# Patient Record
Sex: Female | Born: 1985 | Race: White | Marital: Married | Smoking: Former smoker
Health system: Northeastern US, Community
[De-identification: ages and names within clinical notes are randomized; demographics above are authoritative.]

## PROBLEM LIST (undated history)

## (undated) DIAGNOSIS — E079 Disorder of thyroid, unspecified: Secondary | ICD-10-CM

## (undated) DIAGNOSIS — F909 Attention-deficit hyperactivity disorder, unspecified type: Secondary | ICD-10-CM

## (undated) HISTORY — DX: Attention-deficit hyperactivity disorder, unspecified type: F90.9

## (undated) HISTORY — DX: Disorder of thyroid, unspecified: E07.9

---

## 2011-01-22 ENCOUNTER — Emergency Department (HOSPITAL_BASED_OUTPATIENT_CLINIC_OR_DEPARTMENT_OTHER)
Admission: EM | Admit: 2011-01-22 | Discharge: 2011-01-23 | Disposition: A | Discharge status: Home / Self Care | Payer: Managed Care, Other (non HMO) | Attending: Emergency Medicine | Admitting: Emergency Medicine

## 2011-01-22 ENCOUNTER — Encounter: Payer: Self-pay | Admitting: *Deleted

## 2011-01-22 DIAGNOSIS — R1013 Epigastric pain: Secondary | ICD-10-CM | POA: Insufficient documentation

## 2011-01-22 LAB — URINALYSIS, ROUTINE W REFLEX MICROSCOPIC
Bilirubin Urine: NEGATIVE
Hgb urine dipstick: NEGATIVE
Specific Gravity, Urine: 1.012 (ref 1.005–1.030)
Urobilinogen, UA: 1 mg/dL (ref 0.0–1.0)

## 2011-01-22 NOTE — ED Provider Notes (Signed)
History     CSN: [id number] Arrival date & time: 01/22/2011 11:35 PM  Chief Complaint  Patient presents with  . Abdominal Pain    (Consider location/radiation/quality/duration/timing/severity/associated sxs/prior treatment) HPI This is a 25 year old white female with a history of abdominal pain that began 5 PM. It is described as sharp pains that move around all over her abdomen. It was severe earlier mild to moderate now lying in bed. It is worse when standing and better when lying down. There is no abdominal distention associated. There is no nausea, vomiting or diarrhea associated. She has eaten without change in pain. There is some associated epigastric discomfort which is vaguely characterized. She had some low back ache earlier which she compared to that which occurs with poor posture.  History reviewed. No pertinent past medical history.  History reviewed. No pertinent past surgical history.  No family history on file.  History  Substance Use Topics  . Smoking status: Never Smoker   . Smokeless tobacco: Not on file  . Alcohol Use: Yes     occasionally    OB History    Grav Para Term Preterm Abortions TAB SAB Ect Mult Living                  Review of Systems  All other systems reviewed and are negative.    Allergies  Review of patient's allergies indicates no known allergies.  Home Medications  No current outpatient prescriptions on file.  BP 146/100  Pulse 80  Temp(Src) 98.4 F (36.9 C) (Oral)  Resp 18  Ht 5\' 6"  (1.676 m)  Wt 150 lb (68.04 kg)  BMI 24.21 kg/m2  SpO2 100%  LMP 01/08/2011  Physical Exam General: Well-developed, well-nourished female in no acute distress; appearance consistent with age of record HENT: normocephalic, atraumatic Eyes: pupils equal round and reactive to light; extraocular muscles intact Neck: supple Heart: regular rate and rhythm; no murmurs, rubs or gallops Lungs: clear to auscultation bilaterally Abdomen: soft; mild  epigastric tenderness; nondistended; no masses or hepatosplenomegaly; bowel sounds present Extremities: No deformity; full range of motion; pulses normal Neurologic: Awake, alert and oriented;motor function intact in all extremities and symmetric; no facial droop Skin: Warm and dry Psychiatric: Normal mood and affect   ED Course  Procedures (including critical care time)     MDM   Nursing notes and vitals signs, including pulse oximetry, reviewed.  Summary of this visit's results, reviewed by myself:  Labs:  Results for orders placed during the hospital encounter of 01/22/11  URINALYSIS, ROUTINE W REFLEX MICROSCOPIC      Component Value Range   Color, Urine YELLOW  YELLOW    Appearance CLEAR  CLEAR    Specific Gravity, Urine 1.012  1.005 - 1.030    pH 7.0  5.0 - 8.0    Glucose, UA NEGATIVE  NEGATIVE (mg/dL)   Hgb urine dipstick NEGATIVE  NEGATIVE    Bilirubin Urine NEGATIVE  NEGATIVE    Ketones, ur NEGATIVE  NEGATIVE (mg/dL)   Protein, ur NEGATIVE  NEGATIVE (mg/dL)   Urobilinogen, UA 1.0  0.0 - 1.0 (mg/dL)   Nitrite NEGATIVE  NEGATIVE    Leukocytes, UA NEGATIVE  NEGATIVE   PREGNANCY, URINE      Component Value Range   Preg Test, Ur NEGATIVE    CBC      Component Value Range   WBC 7.3  4.0 - 10.5 (K/uL)   RBC 4.52  3.87 - 5.11 (MIL/uL)   Hemoglobin 13.2  12.0 - 15.0 (g/dL)   HCT 16.1  09.6 - 04.5 (%)   MCV 83.8  78.0 - 100.0 (fL)   MCH 29.2  26.0 - 34.0 (pg)   MCHC 34.8  30.0 - 36.0 (g/dL)   RDW 40.9  81.1 - 91.4 (%)   Platelets 235  150 - 400 (K/uL)  DIFFERENTIAL      Component Value Range   Neutrophils Relative 67  43 - 77 (%)   Neutro Abs 4.9  1.7 - 7.7 (K/uL)   Lymphocytes Relative 19  12 - 46 (%)   Lymphs Abs 1.4  0.7 - 4.0 (K/uL)   Monocytes Relative 9  3 - 12 (%)   Monocytes Absolute 0.6  0.1 - 1.0 (K/uL)   Eosinophils Relative 5  0 - 5 (%)   Eosinophils Absolute 0.3  0.0 - 0.7 (K/uL)   Basophils Relative 0  0 - 1 (%)   Basophils Absolute 0.0  0.0 -  0.1 (K/uL)  COMPREHENSIVE METABOLIC PANEL      Component Value Range   Sodium 138  135 - 145 (mEq/L)   Potassium 3.5  3.5 - 5.1 (mEq/L)   Chloride 104  96 - 112 (mEq/L)   CO2 25  19 - 32 (mEq/L)   Glucose, Bld 105 (*) 70 - 99 (mg/dL)   BUN 12  6 - 23 (mg/dL)   Creatinine, Ser 7.82  0.50 - 1.10 (mg/dL)   Calcium 9.4  8.4 - 95.6 (mg/dL)   Total Protein 7.2  6.0 - 8.3 (g/dL)   Albumin 3.5  3.5 - 5.2 (g/dL)   AST 24  0 - 37 (U/L)   ALT 17  0 - 35 (U/L)   Alkaline Phosphatase 89  39 - 117 (U/L)   Total Bilirubin 0.3  0.3 - 1.2 (mg/dL)   GFR calc non Af Amer >60  >60 (mL/min)   GFR calc Af Amer >60  >60 (mL/min)  LIPASE, BLOOD      Component Value Range   Lipase 22  11 - 59 (U/L)    Imaging Studies: No results found.  1:33 AM Pain is improved significantly in the ED without administration of any analgesics. Patient was advised of essentially normal lab work. She was advised to return for worsening symptoms at which time we may entertain additional studies such as CT scan. Otherwise if she is improving no further workup is indicated at this time.         Hanley Seamen, MD 01/23/11 [phone]

## 2011-01-22 NOTE — ED Notes (Signed)
Pt denies vaginal symptoms as well. States she is not sexually active.

## 2011-01-22 NOTE — ED Notes (Signed)
C/o sharp abdominal pains that began approx 6.5hours ago. Mostly RLQ and lower back. Denies urinary symptoms. Denies N/V/D or other symptoms.

## 2011-01-23 ENCOUNTER — Encounter (HOSPITAL_BASED_OUTPATIENT_CLINIC_OR_DEPARTMENT_OTHER): Payer: Self-pay | Admitting: *Deleted

## 2011-01-23 ENCOUNTER — Emergency Department (HOSPITAL_BASED_OUTPATIENT_CLINIC_OR_DEPARTMENT_OTHER)
Admission: EM | Admit: 2011-01-23 | Discharge: 2011-01-23 | Disposition: A | Discharge status: Home / Self Care | Payer: Managed Care, Other (non HMO) | Attending: Emergency Medicine | Admitting: Emergency Medicine

## 2011-01-23 DIAGNOSIS — R1013 Epigastric pain: Secondary | ICD-10-CM | POA: Insufficient documentation

## 2011-01-23 LAB — CBC
HCT: 37.9 % (ref 36.0–46.0)
MCV: 83.8 fL (ref 78.0–100.0)
Platelets: 240 10*3/uL (ref 150–400)
RBC: 4.69 MIL/uL (ref 3.87–5.11)
RDW: 12.4 % (ref 11.5–15.5)
WBC: 6.5 10*3/uL (ref 4.0–10.5)
WBC: 7.3 10*3/uL (ref 4.0–10.5)

## 2011-01-23 LAB — DIFFERENTIAL
Basophils Absolute: 0 10*3/uL (ref 0.0–0.1)
Eosinophils Relative: 5 % (ref 0–5)
Lymphocytes Relative: 19 % (ref 12–46)
Lymphs Abs: 1.4 10*3/uL (ref 0.7–4.0)
Monocytes Absolute: 0.6 10*3/uL (ref 0.1–1.0)

## 2011-01-23 LAB — COMPREHENSIVE METABOLIC PANEL
ALT: 19 U/L (ref 0–35)
AST: 26 U/L (ref 0–37)
BUN: 12 mg/dL (ref 6–23)
CO2: 25 mEq/L (ref 19–32)
CO2: 26 mEq/L (ref 19–32)
Calcium: 9.4 mg/dL (ref 8.4–10.5)
Calcium: 9.5 mg/dL (ref 8.4–10.5)
Chloride: 103 mEq/L (ref 96–112)
Creatinine, Ser: 0.7 mg/dL (ref 0.50–1.10)
GFR calc Af Amer: >60 mL/min (ref >60)
GFR calc non Af Amer: >60 mL/min (ref >60)
GFR calc non Af Amer: >60 mL/min (ref >60)
Glucose, Bld: 105 mg/dL — ABNORMAL HIGH (ref 70–99)
Sodium: 137 mEq/L (ref 135–145)
Total Bilirubin: 0.7 mg/dL (ref 0.3–1.2)

## 2011-01-23 LAB — LIPASE, BLOOD: Lipase: 22 U/L (ref 11–59)

## 2011-01-23 MED ORDER — HYDROCODONE-ACETAMINOPHEN 5-500 MG PO TABS: 1.0000 | ORAL_TABLET | Freq: Four times a day (QID) | ORAL | Status: AC | PRN

## 2011-01-23 MED ORDER — PANTOPRAZOLE SODIUM 40 MG IV SOLR
40.0000 mg | Freq: Once | INTRAVENOUS | Status: AC
  Administered 2011-01-23: 40 mg via INTRAVENOUS
  Filled 2011-01-23: qty 40

## 2011-01-23 MED ORDER — MORPHINE SULFATE 4 MG/ML IJ SOLN
4.0000 mg | Freq: Once | INTRAMUSCULAR | Status: AC
  Administered 2011-01-23: 4 mg via INTRAVENOUS
  Filled 2011-01-23: qty 1

## 2011-01-23 MED ORDER — SODIUM CHLORIDE 0.9 % IV SOLN
Freq: Once | INTRAVENOUS | Status: AC
  Administered 2011-01-23: via INTRAVENOUS

## 2011-01-23 MED ORDER — ONDANSETRON HCL 4 MG/2ML IJ SOLN
4.0000 mg | Freq: Once | INTRAMUSCULAR | Status: AC
  Administered 2011-01-23: 4 mg via INTRAVENOUS
  Filled 2011-01-23: qty 2

## 2011-01-23 NOTE — ED Notes (Signed)
Was seen here last night for abd pain and now returns today with worsening of sxs. Denies any N/V, no fever or diarrhea.

## 2011-01-23 NOTE — ED Provider Notes (Signed)
History     CSN: [id number] Arrival date & time: 01/23/2011  8:00 PM  Chief Complaint  Patient presents with  . Abdominal Pain    (Consider location/radiation/quality/duration/timing/severity/associated sxs/prior treatment) HPI Comments: Pt states that she was seen last night and she started to feel better:pt states that the episode last night started after eating a quesadilla and today it started after eating pizza:pt states that it is worse with sitting up:pt denies fever, n/v diarrhea:pt state that she started her menstrual cycle today which she thought might be related:pt states that she hasn't taken any pain medication today because she didn't want to mask the symptoms  Patient is a 25 y.o. female presenting with abdominal pain. The history is provided by the patient.  Abdominal Pain The primary symptoms of the illness include abdominal pain and vaginal bleeding. The primary symptoms of the illness do not include nausea, vomiting or dysuria. The current episode started 2 days ago. The onset of the illness was sudden. The problem has been gradually worsening.  The patient states that she believes she is currently not pregnant. The patient has not had a change in bowel habit. Symptoms associated with the illness do not include diaphoresis, heartburn, constipation or hematuria.    History reviewed. No pertinent past medical history.  History reviewed. No pertinent past surgical history.  No family history on file.  History  Substance Use Topics  . Smoking status: Never Smoker   . Smokeless tobacco: Not on file  . Alcohol Use: Yes     occasionally    OB History    Grav Para Term Preterm Abortions TAB SAB Ect Mult Living                  Review of Systems  Constitutional: Negative for diaphoresis.  Gastrointestinal: Positive for abdominal pain. Negative for heartburn, nausea, vomiting and constipation.  Genitourinary: Positive for vaginal bleeding. Negative for dysuria and  hematuria.  All other systems reviewed and are negative.    Allergies  Review of patient's allergies indicates no known allergies.  Home Medications  No current outpatient prescriptions on file.  BP 141/92  Pulse 83  Temp(Src) 98.3 F (36.8 C) (Oral)  Resp 22  Ht 5\' 6"  (1.676 m)  Wt 150 lb (68.04 kg)  BMI 24.21 kg/m2  SpO2 100%  LMP 01/23/2011  Physical Exam  Nursing note and vitals reviewed. Constitutional: She is oriented to person, place, and time. She appears well-developed and well-nourished. No distress.  HENT:  Head: Normocephalic and atraumatic.  Cardiovascular: Normal rate and regular rhythm.   Pulmonary/Chest: Effort normal and breath sounds normal.  Abdominal: Soft. There is tenderness in the epigastric area.  Musculoskeletal: Normal range of motion.  Neurological: She is alert and oriented to person, place, and time.  Skin: Skin is warm and dry.  Psychiatric: She has a normal mood and affect.    ED Course  Procedures (including critical care time)  Results for orders placed during the hospital encounter of 01/23/11  CBC      Component Value Range   WBC 6.5  4.0 - 10.5 (K/uL)   RBC 4.69  3.87 - 5.11 (MIL/uL)   Hemoglobin 13.5  12.0 - 15.0 (g/dL)   HCT 40.9  81.1 - 91.4 (%)   MCV 83.8  78.0 - 100.0 (fL)   MCH 28.8  26.0 - 34.0 (pg)   MCHC 34.4  30.0 - 36.0 (g/dL)   RDW 78.2  95.6 - 21.3 (%)  Platelets 240  150 - 400 (K/uL)  COMPREHENSIVE METABOLIC PANEL      Component Value Range   Sodium 137  135 - 145 (mEq/L)   Potassium 3.9  3.5 - 5.1 (mEq/L)   Chloride 103  96 - 112 (mEq/L)   CO2 26  19 - 32 (mEq/L)   Glucose, Bld 137 (*) 70 - 99 (mg/dL)   BUN 11  6 - 23 (mg/dL)   Creatinine, Ser 4.09  0.50 - 1.10 (mg/dL)   Calcium 9.5  8.4 - 81.1 (mg/dL)   Total Protein 7.4  6.0 - 8.3 (g/dL)   Albumin 3.7  3.5 - 5.2 (g/dL)   AST 26  0 - 37 (U/L)   ALT 19  0 - 35 (U/L)   Alkaline Phosphatase 84  39 - 117 (U/L)   Total Bilirubin 0.7  0.3 - 1.2 (mg/dL)    GFR calc non Af Amer >60  >60 (mL/min)   GFR calc Af Amer >60  >60 (mL/min)  LIPASE, BLOOD      Component Value Range   Lipase 24  11 - 59 (U/L)      1. Abdominal pain, acute, epigastric       MDM  Discussed blood sugar with pt:pt feeling better and tolerating po without any problem:pt continues to be afebrile:will have pt come back for outpt ultrasound:will discharge with something for pain        Teressa Lower, NP 01/23/11 2223

## 2011-01-23 NOTE — ED Notes (Signed)
Dr. Molpus at bedside. 

## 2011-01-24 NOTE — ED Provider Notes (Signed)
Medical screening examination/treatment/procedure(s) were performed by non-physician practitioner and as supervising physician I was immediately available for consultation/collaboration.   Lesbia Ottaway, MD 01/24/11 0049 

## 2011-01-25 ENCOUNTER — Ambulatory Visit (HOSPITAL_BASED_OUTPATIENT_CLINIC_OR_DEPARTMENT_OTHER)
Admission: RE | Admit: 2011-01-25 | Discharge: 2011-01-25 | Disposition: A | Discharge status: Home / Self Care | Payer: Managed Care, Other (non HMO) | Source: Ambulatory Visit | Attending: Nurse Practitioner | Admitting: Nurse Practitioner

## 2011-01-25 DIAGNOSIS — R1013 Epigastric pain: Secondary | ICD-10-CM

## 2011-01-25 DIAGNOSIS — R1011 Right upper quadrant pain: Secondary | ICD-10-CM

## 2011-02-04 ENCOUNTER — Encounter: Payer: Self-pay | Admitting: Internal Medicine

## 2011-02-13 ENCOUNTER — Ambulatory Visit (INDEPENDENT_AMBULATORY_CARE_PROVIDER_SITE_OTHER): Payer: Managed Care, Other (non HMO) | Admitting: Internal Medicine

## 2011-02-13 ENCOUNTER — Encounter: Payer: Self-pay | Admitting: Internal Medicine

## 2011-02-13 VITALS — BP 114/74 | HR 72 | Ht 67.2 in | Wt 165.4 lb

## 2011-02-13 DIAGNOSIS — K589 Irritable bowel syndrome without diarrhea: Secondary | ICD-10-CM

## 2011-02-13 MED ORDER — ALIGN 4 MG PO CAPS: 1.0000 | ORAL_CAPSULE | Freq: Every day | ORAL | Status: AC

## 2011-02-13 NOTE — Progress Notes (Signed)
  Subjective:    Patient ID: Cheryl Odom, female    DOB: November 10, 1985, 25 y.o.   MRN: [id number]  HPI Pleasant young woman with abdominal pain and mucous in her stools. Initially progressive abdominal pain in epigastrium, burning. She went to ED after several hours and [place] ok, labs ok. 4 days later, she took Vicodin, and with recurrence she went back to ED. That time was after eating. She aborted another episode with a Vicodin a few days later. No prior history of similar problems. Also having increased gas and hyperactive bowel sounds. Bowels are not normal. She has increased water. Avoiding sodas. She has had some constipation at times with this and when defecation occurs has had some chunky stools with mucous and since she has had irregular defecation, small amounts and broken up and incomplete defecation. No vomiting, some nausea. Some weight gain, some early satiety since starting school. 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 She does not think she is under any significant increased stress.  Review of Systems Mild joint pains, menses are regular. All other ROS negative.    Objective:   Physical Exam General: Well-developed, well-nourished and in no acute distress Vitals: Reviewed and listed above Eyes:anicteric. Mouth and posterior pharynx: normal.  Neck: supple w/o thyromegaly or mass.  Lungs: clear. Heart: S1S2, no rubs, murmurs, gallops. Abdomen: soft, non-tender, no hepatosplenomegaly, hernia, or mass and BS+.  Rectal: Lymphatics: no cervical, [place] or inguinal nodes. Extremities:  no edema Skin no rash. Neuro: nonfocal. A&O x 3.  Psych: appropriate mood and  affect.    Lab Results  Component Value Date   WBC 6.5 01/23/2011   HGB 13.5 01/23/2011   HCT 39.3 01/23/2011   MCV 83.8 01/23/2011   PLT 240 01/23/2011     Chemistry      Component Value Date/Time   NA 137 01/23/2011 2022   K 3.9 01/23/2011 2022   CL 103 01/23/2011 2022   CO2 26 01/23/2011 2022   BUN 11 01/23/2011  2022   CREATININE 0.80 01/23/2011 2022      Component Value Date/Time   CALCIUM 9.5 01/23/2011 2022   ALKPHOS 84 01/23/2011 2022   AST 26 01/23/2011 2022   ALT 19 01/23/2011 2022   BILITOT 0.7 01/23/2011 2022    .lastu    Assessment & Plan:

## 2011-02-13 NOTE — Patient Instructions (Signed)
It sounds like you are developing irritable bowel syndrome. Please read the information provided.  I want you to take the probiotic called Align for 4 weeks. We have provided samples. In addition you should start a fiber supplement like Metamucil taking 1 teaspoon daily and increasing that to 1 tablespoon daily. Next in 6-8 ounces of water or other clear liquids the Before you start this take one or 2 Dulcolax tablets to empty your bowels well or you could drink 4-6 glasses of MiraLax in 2-3 hours to empty out.  You should also increase fiber in your diet. Remember this may increased gas but am hoping that moving her bowels better work used to gas and bloating and her having. If you find that fiber causes more bloating and gas use MiraLax on a daily or other regular schedule to help promote good bowel movements.  If all this fails to work almost back and/or schedule a followup visit.

## 2011-02-13 NOTE — Assessment & Plan Note (Signed)
She has a pretty classic story for this. We'll try diet manipulation and a short-term probiotic supplement. She will followup as needed.

## 2011-05-18 ENCOUNTER — Encounter (HOSPITAL_COMMUNITY): Payer: Self-pay

## 2011-05-18 ENCOUNTER — Emergency Department (INDEPENDENT_AMBULATORY_CARE_PROVIDER_SITE_OTHER)
Admission: EM | Admit: 2011-05-18 | Discharge: 2011-05-18 | Disposition: A | Discharge status: Home / Self Care | Payer: Managed Care, Other (non HMO) | Source: Home / Self Care | Attending: Family Medicine | Admitting: Family Medicine

## 2011-05-18 ENCOUNTER — Telehealth (HOSPITAL_COMMUNITY): Payer: Self-pay | Admitting: *Deleted

## 2011-05-18 DIAGNOSIS — E039 Hypothyroidism, unspecified: Secondary | ICD-10-CM

## 2011-05-18 MED ORDER — LEVOTHYROXINE SODIUM 100 MCG PO TABS: 100.0000 ug | ORAL_TABLET | Freq: Every day | ORAL | Status: DC

## 2011-05-18 NOTE — ED Notes (Signed)
Had not felt well for past 3 -4 months, had her thyroid levels checked; results show TSH result 29.9

## 2011-05-18 NOTE — ED Provider Notes (Signed)
History     CSN: [id number]  Arrival date & time 05/18/11  1416   First MD Initiated Contact with Patient 05/18/11 1502      Chief Complaint  Patient presents with  . Thyroid Problem    (Consider location/radiation/quality/duration/timing/severity/associated sxs/prior treatment) Patient is a 26 y.o. female presenting with thyroid problem. The history is provided by the patient.  Thyroid Problem This is a new problem. The current episode started more than 1 week ago (self ordered thyroid profile showed tsh 29.9). The problem occurs constantly. The problem has not changed since onset.Associated symptoms comments: Fatigue, wt gain,tired.Marland Kitchen    History reviewed. No pertinent past medical history.  History reviewed. No pertinent past surgical history.  Family History  Problem Relation Age of Onset  . Diabetes Maternal Grandfather   . Skin cancer Mother   . Cancer Maternal Grandmother     bone marrow  . Hypertension Father     History  Substance Use Topics  . Smoking status: Never Smoker   . Smokeless tobacco: Never Used  . Alcohol Use: Yes     occasionally    OB History    Grav Para Term Preterm Abortions TAB SAB Ect Mult Living                  Review of Systems  Constitutional: Positive for unexpected weight change. Negative for fever and chills.  HENT: Negative.   Gastrointestinal: Negative.   Genitourinary: Negative.   Neurological: Negative.     Allergies  Review of patient's allergies indicates no known allergies.  Home Medications   Current Outpatient Rx  Name Route Sig Dispense Refill  . HYDROCODONE-ACETAMINOPHEN 5-500 MG PO TABS Oral Take 1 tablet by mouth every 6 (six) hours as needed.     Marland Kitchen LEVOTHYROXINE SODIUM 100 MCG PO TABS Oral Take 1 tablet (100 mcg total) by mouth daily. 30 tablet 1    BP 132/73  Pulse 75  Temp(Src) 98.1 F (36.7 C) (Oral)  Resp 16  SpO2 100%  Physical Exam  Nursing note and vitals reviewed. Constitutional: She  appears well-developed and well-nourished.  Neck: Normal range of motion. Neck supple. No tracheal deviation present. No thyromegaly present.  Lymphadenopathy:    She has no cervical adenopathy.  Skin: No rash noted.  Psychiatric: She has a normal mood and affect.    ED Course  Procedures (including critical care time)  Labs Reviewed - No data to display No results found.   1. Hypothyroidism       MDM          Barkley Bruns, MD 05/18/11 [phone]

## 2011-05-20 NOTE — ED Notes (Addendum)
1/21  1634 Sherion from [facility] called on voicemail and asked for [place] to fax pt.'s record to them.  Record faxed but no confirmation received. 1/22 I called and verified her fax number [phone].  Record faxed and confirmation received. Vassie Moselle 05/20/2011

## 2012-04-03 IMAGING — US US ABDOMEN COMPLETE
1 series · 14 of 25 positions shown · non-contrast
Comparison: None.

CLINICAL DATA: Postprandial right upper quadrant and epigastric
pain.

ABDOMINAL ULTRASOUND COMPLETE

[Series 1: us abdomen complete · 0.26mm/px · 14 of 79 slices shown]
[im 1/79]
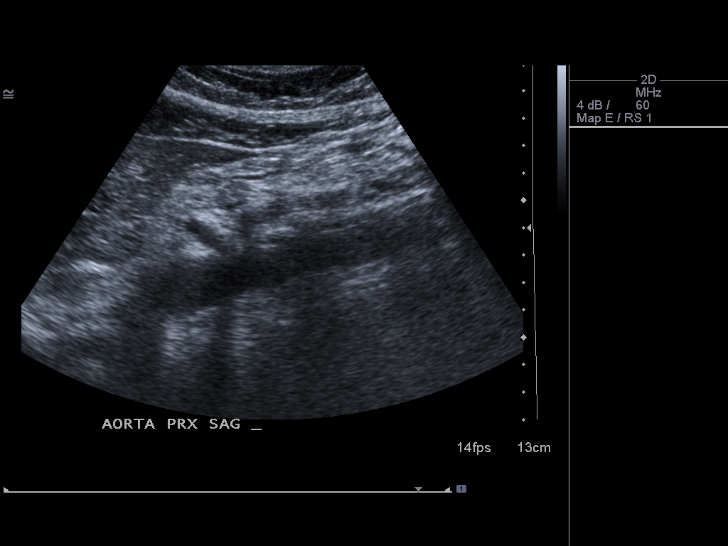
[im 7/79]
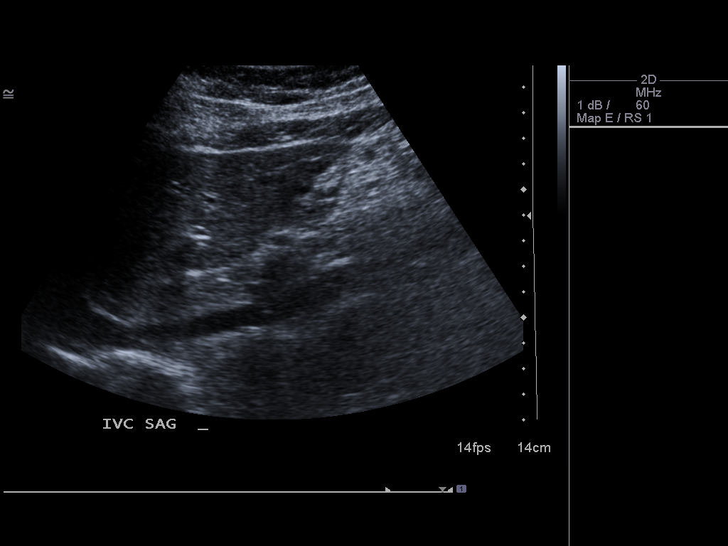
[im 14/79]
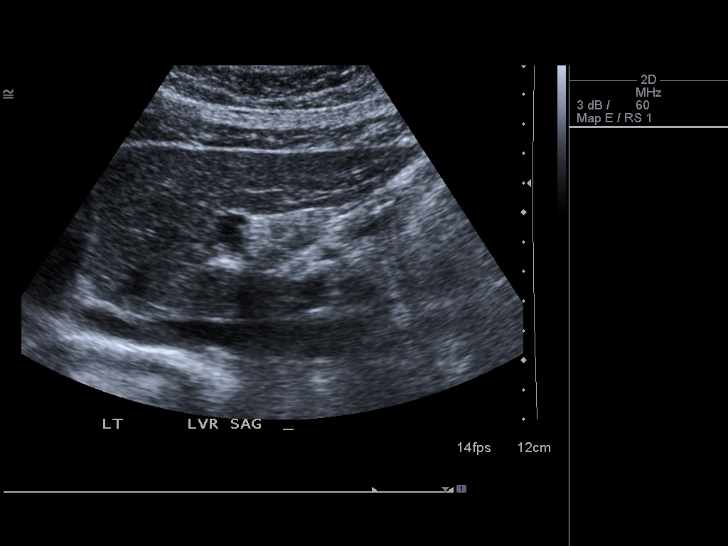
[im 20/79]
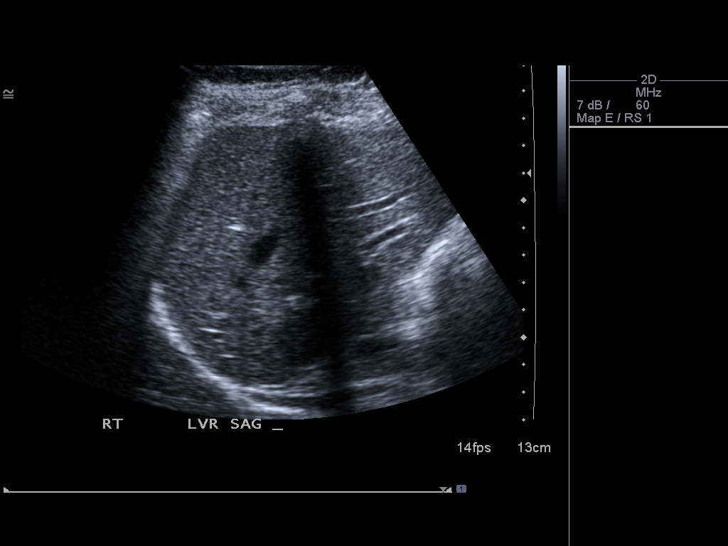
[im 27/79]
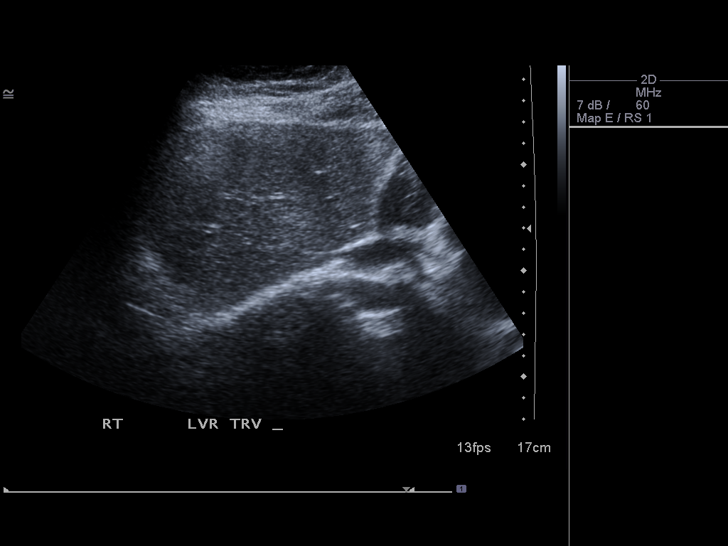
[im 30/79]
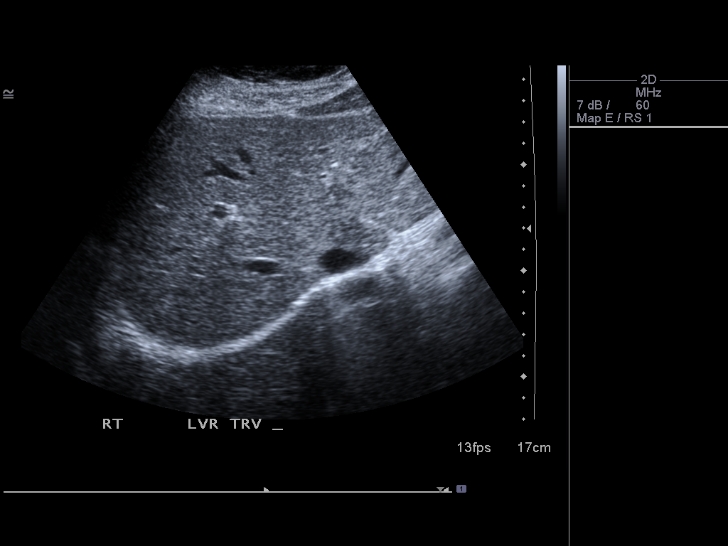
[im 36/79]
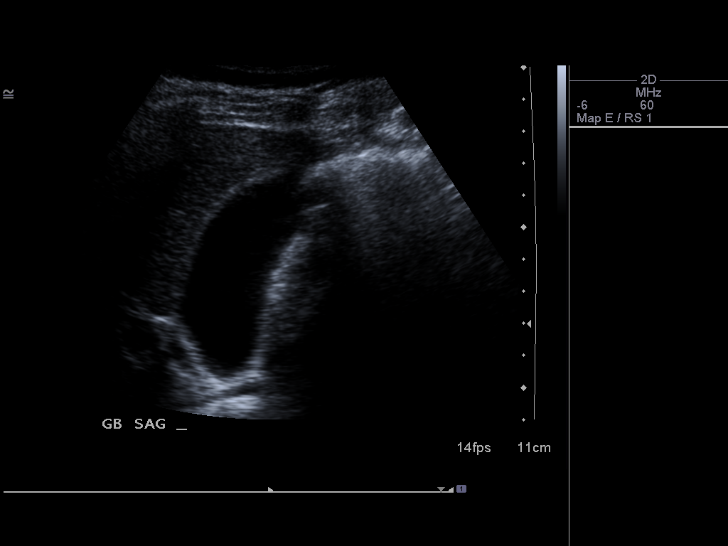
[im 43/79]
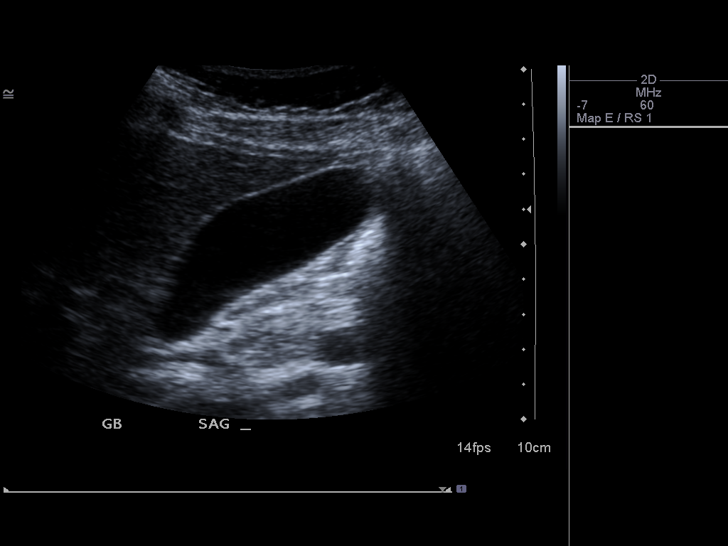
[im 49/79]
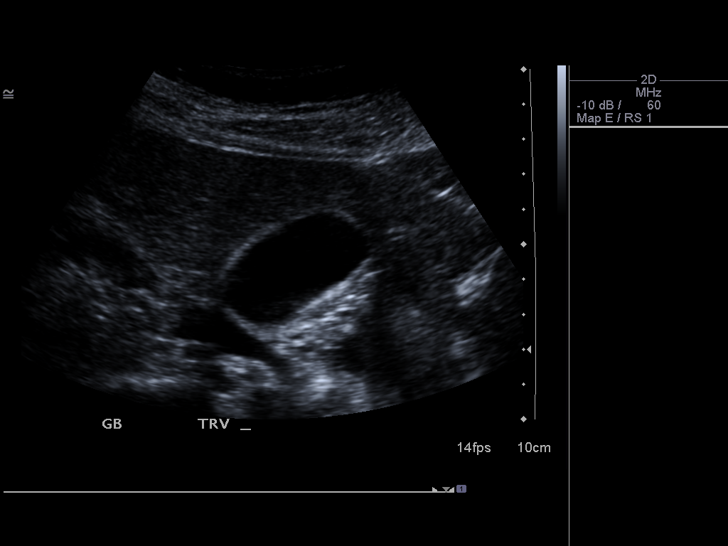
[im 53/79]
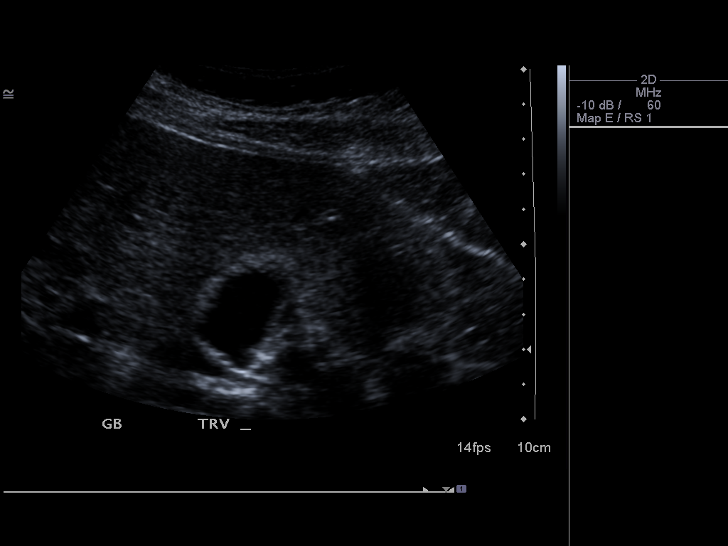
[im 59/79]
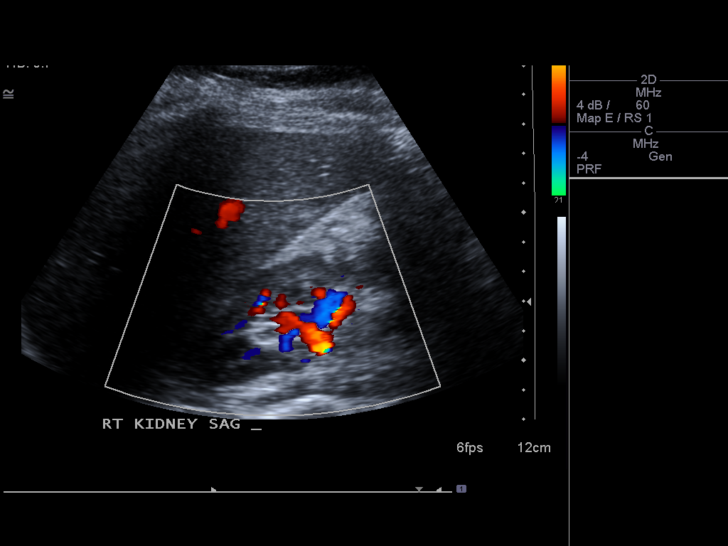
[im 66/79]
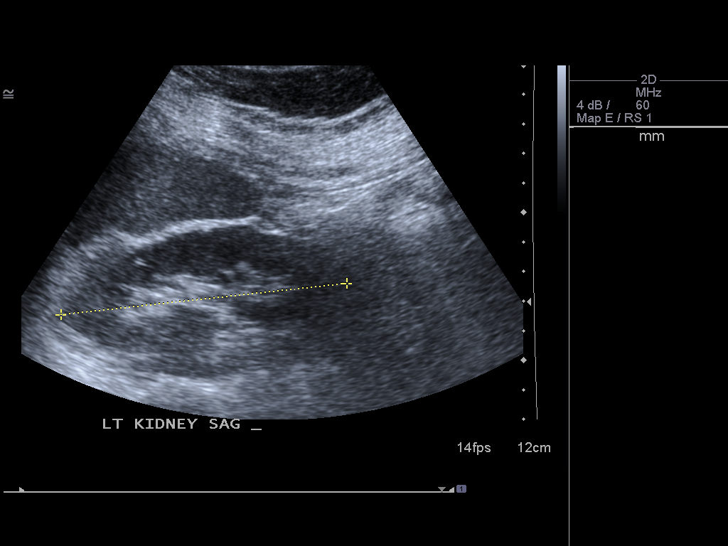
[im 72/79]
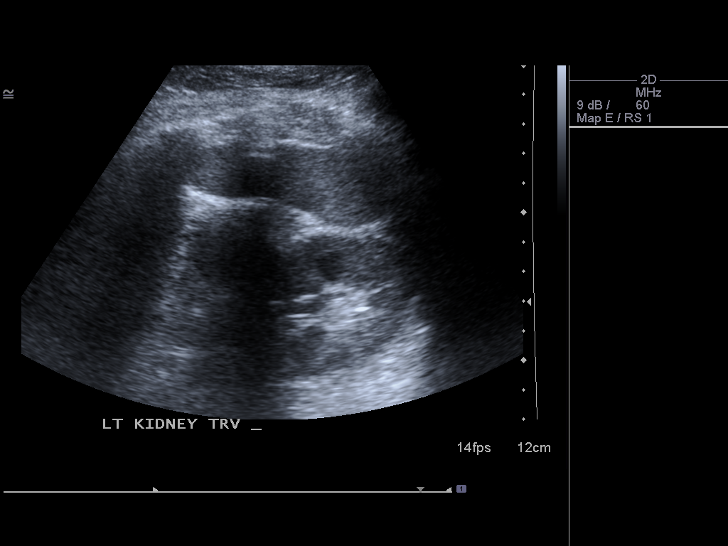
[im 79/79]
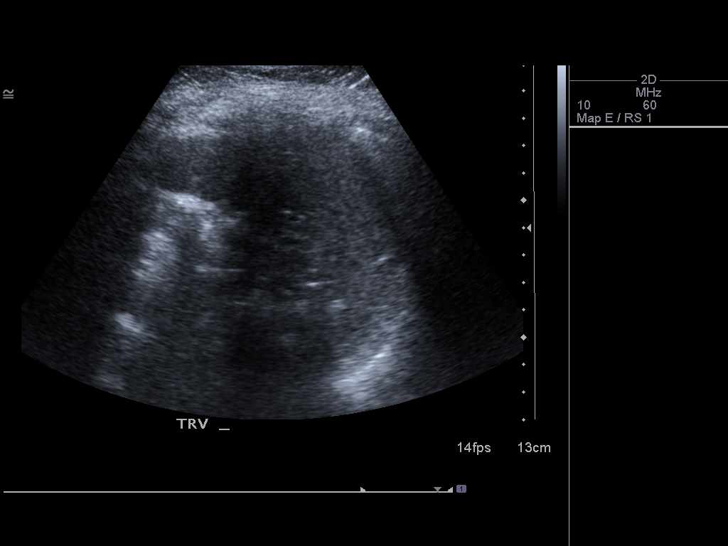

[14 of 25 positions shown; findings below may reference images not displayed]

FINDINGS: Gallbladder:  No gallstones, gallbladder wall thickening, or
pericholecystic fluid.

Common Bile Duct:  Within normal limits in caliber. Measures 2 mm
in diameter.

Liver: No focal mass lesion identified.  Within normal limits in
parenchymal echogenicity.

IVC:  Appears normal.

Pancreas:  No abnormality identified.

Spleen:  Within normal limits in size and echotexture.

Right kidney:  Normal in size and parenchymal echogenicity.  No
evidence of mass or hydronephrosis.

Left kidney:  Normal in size and parenchymal echogenicity.  No
evidence of mass or hydronephrosis.

Abdominal Aorta:  No aneurysm identified.
IMPRESSION: Negative abdominal ultrasound.

## 2013-02-01 ENCOUNTER — Other Ambulatory Visit: Payer: Self-pay | Admitting: *Deleted

## 2013-02-01 MED ORDER — LEVOTHYROXINE SODIUM 100 MCG PO TABS: 100.0000 ug | ORAL_TABLET | Freq: Every day | ORAL | Status: DC

## 2013-02-02 ENCOUNTER — Other Ambulatory Visit: Payer: Self-pay | Admitting: *Deleted

## 2013-02-02 MED ORDER — LEVOTHYROXINE SODIUM 100 MCG PO TABS: 100.0000 ug | ORAL_TABLET | Freq: Every day | ORAL | Status: DC

## 2013-06-16 ENCOUNTER — Other Ambulatory Visit: Payer: Self-pay | Admitting: *Deleted

## 2013-06-16 ENCOUNTER — Other Ambulatory Visit: Payer: Self-pay | Admitting: Endocrinology

## 2013-06-16 MED ORDER — LEVOTHYROXINE SODIUM 100 MCG PO TABS
100.0000 ug | ORAL_TABLET | Freq: Every day | ORAL | Status: DC
Start: 2013-06-16 — End: 2013-06-30

## 2013-06-19 ENCOUNTER — Other Ambulatory Visit: Payer: Managed Care, Other (non HMO)

## 2013-06-21 ENCOUNTER — Other Ambulatory Visit: Payer: Self-pay | Admitting: *Deleted

## 2013-06-21 ENCOUNTER — Other Ambulatory Visit (INDEPENDENT_AMBULATORY_CARE_PROVIDER_SITE_OTHER): Payer: 59

## 2013-06-21 DIAGNOSIS — E039 Hypothyroidism, unspecified: Secondary | ICD-10-CM | POA: Insufficient documentation

## 2013-06-21 LAB — TSH: TSH: 0.94 u[IU]/mL (ref 0.35–5.50)

## 2013-06-21 LAB — T4, FREE: FREE T4: 1.28 ng/dL (ref 0.60–1.60)

## 2013-06-29 ENCOUNTER — Ambulatory Visit: Payer: Managed Care, Other (non HMO) | Admitting: Endocrinology

## 2013-06-30 ENCOUNTER — Ambulatory Visit (INDEPENDENT_AMBULATORY_CARE_PROVIDER_SITE_OTHER): Payer: 59 | Admitting: Endocrinology

## 2013-06-30 ENCOUNTER — Encounter: Payer: Self-pay | Admitting: Endocrinology

## 2013-06-30 VITALS — BP 118/72 | HR 80 | Temp 97.8°F | Resp 16 | Ht 66.0 in | Wt 162.8 lb

## 2013-06-30 DIAGNOSIS — E039 Hypothyroidism, unspecified: Secondary | ICD-10-CM

## 2013-06-30 MED ORDER — LEVOTHYROXINE SODIUM 100 MCG PO TABS: 100.0000 ug | ORAL_TABLET | Freq: Every day | ORAL | Status: DC

## 2013-06-30 NOTE — Patient Instructions (Signed)
Continue same dosage before breakfast daily.   Avoid taking any calcium or iron supplements with the thyroid supplement. 

## 2013-06-30 NOTE — Progress Notes (Signed)
Patient ID: Cheryl Odom, female   DOB: 08-24-1985, 28 y.o.   MRN: [id number]   Reason for Appointment:  Hypothyroidism, followup visit   History of Present Illness:   The hypothyroidism was first diagnosed in ? 2013 Initially the patient had complaints of excessive sleepiness, fatigue, cold sensitivity, weight gain. She was started on a smaller levothyroxine dosage initially but subsequently increased to 100 mcg Currently her old records are not available for review The last visit was in 2014   Currently she has no complaints of unusual fatigue, cold intolerance or hair loss  The patient is taking the thyroid supplement very regularly in the morning before breakfast.   On the last visit the dose was continued unchanged  No visits with results within 1 Week(s) from this visit. Latest known visit with results is:  Appointment on 06/21/2013  Component Date Value Ref Range Status  . TSH 06/21/2013 0.94  0.35 - 5.50 uIU/mL Final  . Free T4 06/21/2013 1.28  0.60 - 1.60 ng/dL Final      Medication List       This list is accurate as of: 06/30/13  8:37 AM.  Always use your most recent med list.               levothyroxine 100 MCG tablet  Commonly known as:  SYNTHROID  Take 1 tablet (100 mcg total) by mouth daily.        Allergies: No Known Allergies  No past medical history on file.  No past surgical history on file.  Family History  Problem Relation Age of Onset  . Diabetes Maternal Grandfather   . Skin cancer Mother   . Cancer Maternal Grandmother     bone marrow  . Hypertension Father     Social History:  reports that she has never smoked. She has never used smokeless tobacco. She reports that she drinks alcohol. She reports that she does not use illicit drugs.  REVIEW Of SYSTEMS:  History of IBS.    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 Avoid taking any calcium or iron supplements with the thyroid supplement.  Will also review her previous records when available    [facility] 06/30/2013, 8:37 AM

## 2014-06-05 LAB — TSH: TSH: 2.03 u[IU]/mL (ref <=5.90)

## 2014-06-28 ENCOUNTER — Other Ambulatory Visit: Payer: 59

## 2014-07-02 ENCOUNTER — Ambulatory Visit: Payer: 59 | Admitting: Endocrinology

## 2014-08-03 ENCOUNTER — Telehealth: Payer: Self-pay | Admitting: Endocrinology

## 2014-08-03 ENCOUNTER — Other Ambulatory Visit: Payer: Self-pay | Admitting: Endocrinology

## 2014-08-03 NOTE — Telephone Encounter (Signed)
Pt needs [place] to call in refills to [facility] on [address] can she get a bridge amount of her synthroid med to make it to her appt.

## 2014-08-03 NOTE — Telephone Encounter (Signed)
error 

## 2014-08-06 ENCOUNTER — Other Ambulatory Visit (INDEPENDENT_AMBULATORY_CARE_PROVIDER_SITE_OTHER): Payer: 59

## 2014-08-06 ENCOUNTER — Other Ambulatory Visit: Payer: Self-pay | Admitting: *Deleted

## 2014-08-06 DIAGNOSIS — E039 Hypothyroidism, unspecified: Secondary | ICD-10-CM | POA: Diagnosis not present

## 2014-08-06 LAB — TSH: TSH: 5.24 u[IU]/mL — ABNORMAL HIGH (ref 0.35–4.50)

## 2014-08-06 LAB — T4, FREE: FREE T4: 0.89 ng/dL (ref 0.60–1.60)

## 2014-08-06 NOTE — Telephone Encounter (Signed)
Patient need refill of synthroid, enough pill to last her to her next appt. Please advise she has been out for three days already

## 2014-08-06 NOTE — Addendum Note (Signed)
Addended by: Rockie Neighbours B on: 08/06/2014 04:14 PM   Modules accepted: Orders

## 2014-08-06 NOTE — Telephone Encounter (Signed)
Sent to Dr Dwyane Dee for approval.

## 2014-08-07 ENCOUNTER — Telehealth: Payer: Self-pay

## 2014-08-07 MED ORDER — LEVOTHYROXINE SODIUM 100 MCG PO TABS: 100.0000 ug | ORAL_TABLET | Freq: Every day | ORAL | Status: DC

## 2014-08-07 NOTE — Telephone Encounter (Signed)
Pt called and would like a refill on her thyroid rx. PCP did a thyroid test prior to the one that she did at our lab. Her last TSH here was high (5.24) due to her not having the rx for more than 5 day.  PCP is sending over TSH lab that was done recently.

## 2014-08-07 NOTE — Telephone Encounter (Signed)
Okay to send prescription for 30 days.  Will discuss further with her tomorrow when she comes in

## 2014-08-07 NOTE — Telephone Encounter (Signed)
Pt informed of md response. erx sent to [facility] per pt request.

## 2014-08-08 ENCOUNTER — Encounter: Payer: Self-pay | Admitting: Endocrinology

## 2014-08-08 ENCOUNTER — Ambulatory Visit (INDEPENDENT_AMBULATORY_CARE_PROVIDER_SITE_OTHER): Payer: 59 | Admitting: Endocrinology

## 2014-08-08 VITALS — BP 108/72 | HR 83 | Temp 98.1°F | Ht 66.0 in | Wt 154.0 lb

## 2014-08-08 DIAGNOSIS — E038 Other specified hypothyroidism: Secondary | ICD-10-CM | POA: Diagnosis not present

## 2014-08-08 DIAGNOSIS — E063 Autoimmune thyroiditis: Secondary | ICD-10-CM

## 2014-08-08 MED ORDER — LEVOTHYROXINE SODIUM 100 MCG PO TABS: 100.0000 ug | ORAL_TABLET | Freq: Every day | ORAL | Status: DC

## 2014-08-08 NOTE — Progress Notes (Signed)
Pre visit review using our clinic review tool, if applicable. No additional management support is needed unless otherwise documented below in the visit note. 

## 2014-08-08 NOTE — Progress Notes (Signed)
Patient ID: Cheryl Odom, female   DOB: 1985/06/14, 29 y.o.   MRN: [id number]   Reason for Appointment:  Hypothyroidism, followup visit   History of Present Illness:   The hypothyroidism was first diagnosed in ? 2013 Initially the patient had complaints of excessive sleepiness, fatigue, cold sensitivity, weight gain. She was started on a smaller levothyroxine dosage initially but subsequently increased to 100 mcg Currently her old records are not available for review The last visit was in 06/2013   Her TSH was normal in 05/2014 with her PCP but since she ran out of her prescription a few days prior to her labs this month her TSH was high Also without her thyroid supplement she was starting to feel more tired Otherwise she has no complaints of unusual fatigue, cold intolerance or hair loss  The patient is taking the thyroid supplement very regularly in the morning before breakfast.   On the last visit the dose was continued unchanged  Lab Results  Component Value Date   TSH 5.24* 08/06/2014   TSH 2.03 06/05/2014   TSH 0.94 06/21/2013   FREET4 0.89 08/06/2014   FREET4 1.28 06/21/2013       Medication List       This list is accurate as of: 08/08/14 11:59 PM.  Always use your most recent med list.               levothyroxine 100 MCG tablet  Commonly known as:  SYNTHROID  Take 1 tablet (100 mcg total) by mouth daily.        Allergies: No Known Allergies  No past medical history on file.  No past surgical history on file.  Family History  Problem Relation Age of Onset  . Diabetes Maternal Grandfather   . Skin cancer Mother   . Cancer Maternal Grandmother     bone marrow  . Hypertension Father   . Thyroid disease Neg Hx     Social History:  reports that she has never smoked. She has never used smokeless tobacco. She reports that she drinks alcohol. She reports that she does not use illicit drugs.  REVIEW Of SYSTEMS:  Wt Readings from Last 3 Encounters:   08/08/14 154 lb (69.854 kg)  06/30/13 162 lb 12.8 oz (73.846 kg)  02/13/11 165 lb 6.4 oz (75.025 kg)   History of IBS.    Examination:   BP 108/72 mmHg  Pulse 83  Temp(Src) 98.1 F (36.7 C) (Oral)  Ht 5\' 6"  (1.676 m)  Wt 154 lb (69.854 kg)  BMI 24.87 kg/m2  SpO2 98%  She looks well, no puffiness of the face or eyes  NECK: Thyroid is not palpable           NEUROLOGIC EXAM:  biceps reflexes show normal relaxation Skin: Not unusual dry    Assessment   Hypothyroidism, secondary to autoimmune thyroid disease Currently she is symptomatically doing well and looks euthyroid No goiter on exam TSH is normal when she was regular with her medication and higher this month only because of running out of her medication   Treatment:   Continue same dosage before breakfast daily.  Avoid taking any calcium or iron supplements with the thyroid supplement.   Brenley Priore 08/09/2014, 9:27 PM

## 2014-12-30 ENCOUNTER — Ambulatory Visit (INDEPENDENT_AMBULATORY_CARE_PROVIDER_SITE_OTHER): Payer: 59 | Admitting: Family Medicine

## 2014-12-30 VITALS — BP 102/78 | HR 91 | Temp 98.4°F | Resp 16 | Ht 66.5 in | Wt 152.0 lb

## 2014-12-30 DIAGNOSIS — K219 Gastro-esophageal reflux disease without esophagitis: Secondary | ICD-10-CM

## 2014-12-30 MED ORDER — OMEPRAZOLE 20 MG PO CPDR: 20.0000 mg | DELAYED_RELEASE_CAPSULE | Freq: Every day | ORAL | Status: DC

## 2014-12-30 MED ORDER — RANITIDINE HCL 150 MG PO TABS: 150.0000 mg | ORAL_TABLET | Freq: Two times a day (BID) | ORAL | Status: DC

## 2014-12-30 NOTE — Progress Notes (Signed)
History and physical examinations reviewed in detail with PA English.  Agree with assessment and plan. Tarahji Ramthun Elayne Guerin, M.D. [facility] [address] [place], [place]  [phone] phone [phone] fax

## 2014-12-30 NOTE — Progress Notes (Signed)
[facility] [address] [place] [address] [phone]  Date:  12/30/2014   Name:  Cheryl Odom   DOB:  11/18/1985   MRN:  [id number]  PCP:  Rachell Cipro, MD    History of Present Illness:  Cheryl Odom is a 29 y.o. female patient who presents to [facility] for chief complaint of heart burn.  Patient states that she has a sensation of heartburn at the center of her sternum.  She has no nausea, or regurge.  It has progressively worsened, and has become difficult to lay down due to the increase of reflux sensation.  She has no cough, n/v, abdominal pain, or blood in stool.  She has no dizziness, or change in appetite.  She is hydrating well.  She has attempted to change her diet with excluding acidic food, but it has not helped much.  May drink 1 etoh beverage per week, not a lot of caffeine or sodas, no nsaid use.  She had recent strep throat and given prednisone and an unknown abx for her sxs last week.    Intentional weight loss with diet.  Patient Active Problem List   Diagnosis Date Noted  . Unspecified hypothyroidism 06/21/2013  . IBS (irritable bowel syndrome) 02/13/2011    Past Medical History  Diagnosis Date  . Thyroid disease     History reviewed. No pertinent past surgical history.  Social History  Substance Use Topics  . Smoking status: Never Smoker   . Smokeless tobacco: Never Used  . Alcohol Use: Yes     Comment: occasionally    Family History  Problem Relation Age of Onset  . Diabetes Maternal Grandfather   . Skin cancer Mother   . Cancer Mother   . Mental illness Mother   . Cancer Maternal Grandmother     bone marrow  . Hypertension Father   . Heart disease Father   . Hyperlipidemia Father   . Stroke Father   . Thyroid disease Neg Hx     No Known Allergies  Medication list has been reviewed and updated.  Current Outpatient Prescriptions on File Prior to Visit  Medication Sig Dispense Refill  . levothyroxine (SYNTHROID)  100 MCG tablet Take 1 tablet (100 mcg total) by mouth daily. 90 tablet 2   No current facility-administered medications on file prior to visit.    ROS ROS otherwise unremarkable unless listed above.   Physical Examination: BP 102/78 mmHg  Pulse 91  Temp(Src) 98.4 F (36.9 C) (Oral)  Resp 16  Ht 5' 6.5" (1.689 m)  Wt 152 lb (68.947 kg)  BMI 24.17 kg/m2  SpO2 99%  LMP 12/17/2014 Ideal Body Weight: Weight in (lb) to have BMI = 25: 156.9 Wt Readings from Last 3 Encounters:  12/30/14 152 lb (68.947 kg)  08/08/14 154 lb (69.854 kg)  06/30/13 162 lb 12.8 oz (73.846 kg)    Physical Exam  Constitutional: She is oriented to person, place, and time. She appears well-developed and well-nourished. No distress.  HENT:  Head: Normocephalic and atraumatic.  Right Ear: Tympanic membrane, external ear and ear canal normal.  Left Ear: Tympanic membrane, external ear and ear canal normal.  Nose: No mucosal edema or rhinorrhea.  Mouth/Throat: No oropharyngeal exudate, posterior oropharyngeal edema or posterior oropharyngeal erythema.  Cardiovascular: Normal rate.  Exam reveals no friction rub.   No murmur heard. Pulmonary/Chest: Effort normal and breath sounds normal. No respiratory distress.  Abdominal: Soft. Bowel sounds are normal. She exhibits no distension.  There is no tenderness.  Neurological: She is alert and oriented to person, place, and time.  Skin: Skin is warm and dry. She is not diaphoretic.  Psychiatric: She has a normal mood and affect. Her behavior is normal.   GI cocktail-with improvement of heartburn after 10 minutes  Assessment and Plan: 29 year old female is here today for chief complaint for heartburn.   -Improvement with GI cocktail. -PPI, and H2 blocker given.   -GERD lifestyle and diet restrictions given verbally and in handout. Advised to rtc in weeks for follow up or f/u with pcp.  Alarming sxs given to warrant immediate rtn.  1. Gastroesophageal reflux  disease, esophagitis presence not specified - omeprazole (PRILOSEC) 20 MG capsule; Take 1 capsule (20 mg total) by mouth daily.  Dispense: 30 capsule; Refill: 3 - ranitidine (ZANTAC) 150 MG tablet; Take 1 tablet (150 mg total) by mouth 2 (two) times daily.  Dispense: 60 tablet; Refill: 2   Ivar Drape, PA-C [facility] 12/30/2014 8:44 PM

## 2014-12-30 NOTE — Patient Instructions (Signed)
Gastroesophageal Reflux Disease, Adult  Gastroesophageal reflux disease (GERD) happens when acid from your stomach flows up into the esophagus. When acid comes in contact with the esophagus, the acid causes soreness (inflammation) in the esophagus. Over time, GERD may create small holes (ulcers) in the lining of the esophagus.  CAUSES   · Increased body weight. This puts pressure on the stomach, making acid rise from the stomach into the esophagus.  · Smoking. This increases acid production in the stomach.  · Drinking alcohol. This causes decreased pressure in the lower esophageal sphincter (valve or ring of muscle between the esophagus and stomach), allowing acid from the stomach into the esophagus.  · Late evening meals and a full stomach. This increases pressure and acid production in the stomach.  · A malformed lower esophageal sphincter.  Sometimes, no cause is found.  SYMPTOMS   · Burning pain in the lower part of the mid-chest behind the breastbone and in the mid-stomach area. This may occur twice a week or more often.  · Trouble swallowing.  · Sore throat.  · Dry cough.  · Asthma-like symptoms including chest tightness, shortness of breath, or wheezing.  DIAGNOSIS   Your caregiver may be able to diagnose GERD based on your symptoms. In some cases, X-rays and other tests may be done to check for complications or to check the condition of your stomach and esophagus.  TREATMENT   Your caregiver may recommend over-the-counter or prescription medicines to help decrease acid production. Ask your caregiver before starting or adding any new medicines.   HOME CARE INSTRUCTIONS   · Change the factors that you can control. Ask your caregiver for guidance concerning weight loss, quitting smoking, and alcohol consumption.  · Avoid foods and drinks that make your symptoms worse, such as:  ¨ Caffeine or alcoholic drinks.  ¨ Chocolate.  ¨ Peppermint or mint flavorings.  ¨ Garlic and onions.  ¨ Spicy foods.  ¨ Citrus fruits,  such as oranges, lemons, or limes.  ¨ Tomato-based foods such as sauce, chili, salsa, and pizza.  ¨ Fried and fatty foods.  · Avoid lying down for the 3 hours prior to your bedtime or prior to taking a nap.  · Eat small, frequent meals instead of large meals.  · Wear loose-fitting clothing. Do not wear anything tight around your waist that causes pressure on your stomach.  · Raise the head of your bed 6 to 8 inches with wood blocks to help you sleep. Extra pillows will not help.  · Only take over-the-counter or prescription medicines for pain, discomfort, or fever as directed by your caregiver.  · Do not take aspirin, ibuprofen, or other nonsteroidal anti-inflammatory drugs (NSAIDs).  SEEK IMMEDIATE MEDICAL CARE IF:   · You have pain in your arms, neck, jaw, teeth, or back.  · Your pain increases or changes in intensity or duration.  · You develop nausea, vomiting, or sweating (diaphoresis).  · You develop shortness of breath, or you faint.  · Your vomit is green, yellow, black, or looks like coffee grounds or blood.  · Your stool is red, bloody, or black.  These symptoms could be signs of other problems, such as heart disease, gastric bleeding, or esophageal bleeding.  MAKE SURE YOU:   · Understand these instructions.  · Will watch your condition.  · Will get help right away if you are not doing well or get worse.  Document Released: 01/21/2005 Document Revised: 07/06/2011 Document Reviewed: 10/31/2010  ExitCare® Patient   Information ©2015 [employer]. This information is not intended to replace advice given to you by your health care provider. Make sure you discuss any questions you have with your health care provider.  Food Choices for Gastroesophageal Reflux Disease  When you have gastroesophageal reflux disease (GERD), the foods you eat and your eating habits are very important. Choosing the right foods can help ease the discomfort of GERD.  WHAT GENERAL GUIDELINES DO I NEED TO FOLLOW?  · Choose fruits,  vegetables, whole grains, low-fat dairy products, and low-fat meat, fish, and poultry.  · Limit fats such as oils, salad dressings, butter, nuts, and avocado.  · Keep a food diary to identify foods that cause symptoms.  · Avoid foods that cause reflux. These may be different for different people.  · Eat frequent small meals instead of three large meals each day.  · Eat your meals slowly, in a relaxed setting.  · Limit fried foods.  · Cook foods using methods other than frying.  · Avoid drinking alcohol.  · Avoid drinking large amounts of liquids with your meals.  · Avoid bending over or lying down until 2-3 hours after eating.  WHAT FOODS ARE NOT RECOMMENDED?  The following are some foods and drinks that may worsen your symptoms:  Vegetables  Tomatoes. Tomato juice. Tomato and spaghetti sauce. Chili peppers. Onion and garlic. Horseradish.  Fruits  Oranges, grapefruit, and lemon (fruit and juice).  Meats  High-fat meats, fish, and poultry. This includes hot dogs, ribs, ham, sausage, salami, and bacon.  Dairy  Whole milk and chocolate milk. Sour cream. Cream. Butter. Ice cream. Cream cheese.   Beverages  Coffee and tea, with or without caffeine. Carbonated beverages or energy drinks.  Condiments  Hot sauce. Barbecue sauce.   Sweets/Desserts  Chocolate and cocoa. Donuts. Peppermint and spearmint.  Fats and Oils  High-fat foods, including French fries and potato chips.  Other  Vinegar. Strong spices, such as black pepper, white pepper, red pepper, cayenne, curry powder, cloves, ginger, and chili powder.  The items listed above may not be a complete list of foods and beverages to avoid. Contact your dietitian for more information.  Document Released: 04/13/2005 Document Revised: 04/18/2013 Document Reviewed: 02/15/2013  ExitCare® Patient Information ©2015 [employer]. This information is not intended to replace advice given to you by your health care provider. Make sure you discuss any questions you have with your  health care provider.

## 2015-02-07 ENCOUNTER — Inpatient Hospital Stay (HOSPITAL_COMMUNITY)
Admission: EM | Admit: 2015-02-07 | Discharge: 2015-02-09 | DRG: 918 | Disposition: A | Discharge status: Home / Self Care | Payer: 59 | Attending: Internal Medicine | Admitting: Internal Medicine

## 2015-02-07 ENCOUNTER — Encounter (HOSPITAL_COMMUNITY): Payer: Self-pay | Admitting: Emergency Medicine

## 2015-02-07 DIAGNOSIS — T63001A Toxic effect of unspecified snake venom, accidental (unintentional), initial encounter: Secondary | ICD-10-CM | POA: Diagnosis not present

## 2015-02-07 DIAGNOSIS — W5911XA Bitten by nonvenomous snake, initial encounter: Secondary | ICD-10-CM | POA: Diagnosis present

## 2015-02-07 DIAGNOSIS — T63061A Toxic effect of venom of other North and South American snake, accidental (unintentional), initial encounter: Principal | ICD-10-CM | POA: Diagnosis present

## 2015-02-07 DIAGNOSIS — E039 Hypothyroidism, unspecified: Secondary | ICD-10-CM | POA: Diagnosis present

## 2015-02-07 LAB — CBC WITH DIFFERENTIAL/PLATELET
BASOS ABS: 0 10*3/uL (ref 0.0–0.1)
BASOS PCT: 0 %
EOS ABS: 0.2 10*3/uL (ref 0.0–0.7)
Eosinophils Relative: 4 %
HCT: 40.5 % (ref 36.0–46.0)
HEMOGLOBIN: 13.4 g/dL (ref 12.0–15.0)
Lymphocytes Relative: 45 %
Lymphs Abs: 2.5 10*3/uL (ref 0.7–4.0)
MCH: 26.9 pg (ref 26.0–34.0)
MCHC: 33.1 g/dL (ref 30.0–36.0)
MCV: 81.3 fL (ref 78.0–100.0)
Monocytes Absolute: 0.5 10*3/uL (ref 0.1–1.0)
Monocytes Relative: 8 %
NEUTROS PCT: 43 %
Neutro Abs: 2.4 10*3/uL (ref 1.7–7.7)
Platelets: 269 10*3/uL (ref 150–400)
RBC: 4.98 MIL/uL (ref 3.87–5.11)
RDW: 14.1 % (ref 11.5–15.5)
WBC: 5.6 10*3/uL (ref 4.0–10.5)

## 2015-02-07 LAB — BASIC METABOLIC PANEL
Anion gap: 9 (ref 5–15)
BUN: 17 mg/dL (ref 6–20)
CALCIUM: 9.5 mg/dL (ref 8.9–10.3)
CO2: 25 mmol/L (ref 22–32)
CREATININE: 0.79 mg/dL (ref 0.44–1.00)
Chloride: 101 mmol/L (ref 101–111)
GFR calc non Af Amer: >60 mL/min (ref >60)
Glucose, Bld: 104 mg/dL — ABNORMAL HIGH (ref 65–99)
Potassium: 3.8 mmol/L (ref 3.5–5.1)
SODIUM: 135 mmol/L (ref 135–145)

## 2015-02-07 LAB — PROTIME-INR
INR: 1.03 (ref 0.00–1.49)
PROTHROMBIN TIME: 13.7 s (ref 11.6–15.2)

## 2015-02-07 LAB — FIBRINOGEN: Fibrinogen: 353 mg/dL (ref 204–475)

## 2015-02-07 MED ORDER — CROTALIDAE POLYVAL IMMUNE FAB IV SOLR: 4.0000 | Freq: Once | INTRAVENOUS | Status: DC

## 2015-02-07 MED ORDER — HYDROMORPHONE HCL 1 MG/ML IJ SOLN
0.5000 mg | Freq: Once | INTRAMUSCULAR | Status: AC
  Administered 2015-02-07: 0.5 mg via INTRAVENOUS
  Filled 2015-02-07: qty 1

## 2015-02-07 MED ORDER — LEVOTHYROXINE SODIUM 100 MCG PO TABS
100.0000 ug | ORAL_TABLET | Freq: Every day | ORAL | Status: DC
  Administered 2015-02-08 – 2015-02-09 (×2): 100 ug via ORAL
  Filled 2015-02-07 (×2): qty 1

## 2015-02-07 MED ORDER — ONDANSETRON HCL 4 MG/2ML IJ SOLN
4.0000 mg | Freq: Once | INTRAMUSCULAR | Status: AC
  Administered 2015-02-07: 4 mg via INTRAVENOUS
  Filled 2015-02-07: qty 2

## 2015-02-07 MED ORDER — MORPHINE SULFATE (PF) 2 MG/ML IV SOLN
1.0000 mg | INTRAVENOUS | Status: DC | PRN
  Administered 2015-02-08 – 2015-02-09 (×7): 1 mg via INTRAVENOUS
  Filled 2015-02-07 (×7): qty 1

## 2015-02-07 MED ORDER — HYDROMORPHONE HCL 1 MG/ML IJ SOLN
1.0000 mg | Freq: Once | INTRAMUSCULAR | Status: AC
  Administered 2015-02-07: 1 mg via INTRAVENOUS
  Filled 2015-02-07: qty 1

## 2015-02-07 MED ORDER — SODIUM CHLORIDE 0.9 % IV SOLN
4.0000 | Freq: Once | INTRAVENOUS | Status: AC
  Administered 2015-02-07: 72 mL via INTRAVENOUS
  Filled 2015-02-07: qty 72

## 2015-02-07 NOTE — ED Provider Notes (Signed)
CSN: [id number]     Arrival date & time 02/07/15  2110 History   First MD Initiated Contact with Patient 02/07/15 2111     Chief Complaint  Patient presents with  . Snake Bite     (Consider location/radiation/quality/duration/timing/severity/associated sxs/prior Treatment) HPI Comments: The patient is a 29 year old female who sustained a snake bite at approximately 8:30 PM, she had acute onset of pain and has had progressive swelling since that time. She denies any systemic symptoms, there is no neurologic symptoms, the swelling is gradually getting worse, persistent, moderate, involves the  left index finger.  The history is provided by the patient.    Past Medical History  Diagnosis Date  . Thyroid disease    History reviewed. No pertinent past surgical history. Family History  Problem Relation Age of Onset  . Diabetes Maternal Grandfather   . Skin cancer Mother   . Cancer Mother   . Mental illness Mother   . Cancer Maternal Grandmother     bone marrow  . Hypertension Father   . Heart disease Father   . Hyperlipidemia Father   . Stroke Father   . Thyroid disease Neg Hx    Social History  Substance Use Topics  . Smoking status: Never Smoker   . Smokeless tobacco: Never Used  . Alcohol Use: Yes     Comment: occasionally   OB History    No data available     Review of Systems  All other systems reviewed and are negative.     Allergies  Review of patient's allergies indicates no known allergies.  Home Medications   Prior to Admission medications   Medication Sig Start Date End Date Taking? Authorizing Provider  levothyroxine (SYNTHROID) 100 MCG tablet Take 1 tablet (100 mcg total) by mouth daily. 08/08/14 08/08/15 Yes Elayne Snare, MD  omeprazole (PRILOSEC) 20 MG capsule Take 1 capsule (20 mg total) by mouth daily. Patient taking differently: Take 20 mg by mouth daily as needed.  12/30/14  Yes Dorian Heckle English, PA  ranitidine (ZANTAC) 150 MG tablet Take 1  tablet (150 mg total) by mouth 2 (two) times daily. Patient taking differently: Take 150 mg by mouth 2 (two) times daily as needed.  12/30/14  Yes Stephanie D English, PA   BP 125/89 mmHg  Pulse 82  Temp(Src) 98.4 F (36.9 C) (Oral)  Resp 13  SpO2 100%  LMP 01/31/2015 Physical Exam  Constitutional: She appears well-developed and well-nourished.  HENT:  Head: Normocephalic and atraumatic.  Eyes: Conjunctivae are normal. Right eye exhibits no discharge. Left eye exhibits no discharge.  Pulmonary/Chest: Effort normal. No respiratory distress.  Musculoskeletal: She exhibits tenderness (swelling and tenderness of the left index finger, puncture wounds noted on the distal phalanx, radial surface, some bruising noted).  Significant swelling and edema of the left index finger  Neurological: She is alert. Coordination normal.  Normal sensation to the finger  Skin: Skin is warm and dry. No rash noted. She is not diaphoretic. No erythema.  Psychiatric: She has a normal mood and affect.  Nursing note and vitals reviewed.   ED Course  Procedures (including critical care time) Labs Review Labs Reviewed  BASIC METABOLIC PANEL - Abnormal; Notable for the following:    Glucose, Bld 104 (*)    All other components within normal limits  CBC WITH DIFFERENTIAL/PLATELET  PROTIME-INR  FIBRINOGEN  CBC WITH DIFFERENTIAL/PLATELET  CBC WITH DIFFERENTIAL/PLATELET  CBC WITH DIFFERENTIAL/PLATELET  FIBRINOGEN  FIBRINOGEN  PROTIME-INR  PROTIME-INR  Imaging Review No results found. I have personally reviewed and evaluated these images and lab results as part of my medical decision-making.    MDM   Final diagnoses:  Snake bite, accidental or unintentional, initial encounter    Over time the finger progressed in swelling, this extended onto the hand and is now progressed towards the wrist. Because she has had progression of the swelling she will need CroFab, this has been ordered, she will be  admitted to the hospital for monitoring over the next 24 hours. Initial laboratory work was unremarkable  swellign has progressed - crofab ordered and started in ED  D/w hospitalist who will admit.  Meds given in ED:  Medications  crotalidae polyvalent immune fab (CROFAB) 4 vial in sodium chloride 0.9 % 322 mL infusion (not administered)  HYDROmorphone (DILAUDID) injection 1 mg (1 mg Intravenous Given 02/07/15 2151)  ondansetron (ZOFRAN) injection 4 mg (4 mg Intravenous Given 02/07/15 2151)  HYDROmorphone (DILAUDID) injection 0.5 mg (0.5 mg Intravenous Given 02/07/15 2315)        Cheryl Chapel, MD 02/07/15 2318

## 2015-02-07 NOTE — ED Notes (Signed)
Pt st's she was sitting groceries down on a ledge and was bitten by a copperhead snake.  Pt has puncture wound to left index finger at the dip joint.  Pt has swelling to finger and hand

## 2015-02-07 NOTE — ED Provider Notes (Signed)
MSE was initiated and I personally evaluated the patient and placed orders (if any) at  9:36 PM on February 07, 2015.   Patient presents to the emergency department with chief complaint of copper head snake bite. Patient was bitten on her left index finger at 8:35 PM. Patient states that she said down some groceries, and put her hand on a ledge, and was bitten. She has not taken anything alleviate the symptoms. The pain is moderate to severe. She reports moderate to severe swelling of the left index finger with some discoloration. It is worsened with palpation and movement.  Patient seen by and discussed with Dr. Sabra Heck. Will order CBC, BMP, INR, and fibrinogen. Skin has been marked. Will need to be monitored for increased swelling and additional symptoms. Will move to acute side for further management.         The patient appears stable so that the remainder of the MSE may be completed by another provider.  Montine Circle, PA-C 02/07/15 2141  Noemi Chapel, MD 02/07/15 [phone]

## 2015-02-07 NOTE — H&P (Signed)
History and Physical  Cheryl Odom HBZ:[id number] DOB: 29-Feb-1986 DOA: 02/07/2015  PCP: Rachell Cipro, MD   Chief Complaint: left hand swelling  History of Present Illness:  Patient is a 29 year old female with history of hypothyroidism who was bitten by a copra head snake earlier today. She immediately started getting swelling in her left hand that has been slowly progressing down to her left wrist. She has no other complaints.   Review of Systems:  CONSTITUTIONAL:  No night sweats.  No fatigue, malaise, lethargy.  No fever or chills. Eyes:  No visual changes.  No eye pain.  No eye discharge.   ENT:    No epistaxis.  No sinus pain.  No sore throat.  No ear pain.  No congestion. RESPIRATORY:  No cough.  No wheeze.  No hemoptysis.  No shortness of breath. CARDIOVASCULAR:  No chest pains.  No palpitations. GASTROINTESTINAL:  No abdominal pain.  No nausea or vomiting.  No diarrhea or constipation.  No hematemesis.  No hematochezia.  No melena. GENITOURINARY:  No urgency.  No frequency.  No dysuria.  No hematuria.  No obstructive symptoms.  No discharge.  No pain.  No significant abnormal bleeding. MUSCULOSKELETAL:  +musculoskeletal pain.  No joint swelling.  No arthritis. NEUROLOGICAL:  No confusion.  No weakness. No headache. No seizure. PSYCHIATRIC:  No depression. No anxiety. No suicidal ideation. SKIN:  No rashes.  +lesions.  +wounds. ENDOCRINE:  No unexplained weight loss.  No polydipsia.  No polyuria.  No polyphagia. HEMATOLOGIC:  No anemia.  No purpura.  No petechiae.  No bleeding.  ALLERGIC AND IMMUNOLOGIC:  No pruritus.  +swelling Other:  Past Medical and Surgical History:   Past Medical History  Diagnosis Date  . Thyroid disease    History reviewed. No pertinent past surgical history.  Social History:   reports that she has never smoked. She has never used smokeless tobacco. She reports that she drinks alcohol. She reports that she does not use illicit  drugs.   No Known Allergies  Family History  Problem Relation Age of Onset  . Diabetes Maternal Grandfather   . Skin cancer Mother   . Cancer Mother   . Mental illness Mother   . Cancer Maternal Grandmother     bone marrow  . Hypertension Father   . Heart disease Father   . Hyperlipidemia Father   . Stroke Father   . Thyroid disease Neg Hx       Prior to Admission medications   Medication Sig Start Date End Date Taking? Authorizing Provider  levothyroxine (SYNTHROID) 100 MCG tablet Take 1 tablet (100 mcg total) by mouth daily. 08/08/14 08/08/15 Yes Elayne Snare, MD  omeprazole (PRILOSEC) 20 MG capsule Take 1 capsule (20 mg total) by mouth daily. Patient taking differently: Take 20 mg by mouth daily as needed.  12/30/14  Yes Dorian Heckle English, PA  ranitidine (ZANTAC) 150 MG tablet Take 1 tablet (150 mg total) by mouth 2 (two) times daily. Patient taking differently: Take 150 mg by mouth 2 (two) times daily as needed.  12/30/14  Yes Dorian Heckle English, PA    Physical Exam: BP 125/89 mmHg  Pulse 82  Temp(Src) 98.4 F (36.9 C) (Oral)  Resp 13  SpO2 100%  LMP 01/31/2015  GENERAL : Well developed, well nourished, alert and cooperative, and appears to be in no acute distress. HEAD: normocephalic. EYES: PERRL, EOMI. EARS:  hearing grossly intact. NOSE: No nasal discharge. THROAT: Oral cavity and pharynx normal.  NECK: Neck supple, non-tender  CARDIAC: Normal S1 and S2. No S3, S4 or murmurs. Rhythm is regular. There is no peripheral edema, cyanosis or pallor. Extremities are warm and well perfused. No carotid bruits. LUNGS: Clear to auscultation and percussion without rales, rhonchi, wheezing or diminished breath sounds. EXTREMITIES: significant edema in left hand and wrist mainly in 2nd finger ( site of bite) with pain on active movement to second finger. Minimal erythema. Intact sensation and motor function.  NEUROLOGICAL: The mental examination revealed the patient was oriented  to person, place, and time.CN II-XII intact.  SKIN: Skin normal color, texture and turgor with no lesions or eruptions. PSYCHIATRIC:  The patient was able to demonstrate good judgement and reason, without hallucinations, abnormal affect or abnormal behaviors during the examination. Patient is not suicidal.          Labs on Admission:  Reviewed.   Radiological Exams on Admission: No results found.   Assessment/Plan  Copperhead snake bite:  Area of swelling marked Elevated hand Pain management with morphine prn  INR, Hb, plt, fibrinogen: WNL. Will repeat tmw No systemic signs except swelling that is progressing.  CroFab given in ER. Will monitor and repeat if needed   Hypothyroidism: continue thyroxine  DVT prophylaxis: SCDs Code Status: Full     Gennaro Africa M.D [facility]

## 2015-02-08 ENCOUNTER — Encounter (HOSPITAL_COMMUNITY): Payer: Self-pay

## 2015-02-08 DIAGNOSIS — E039 Hypothyroidism, unspecified: Secondary | ICD-10-CM | POA: Diagnosis present

## 2015-02-08 DIAGNOSIS — T63001D Toxic effect of unspecified snake venom, accidental (unintentional), subsequent encounter: Secondary | ICD-10-CM | POA: Diagnosis not present

## 2015-02-08 DIAGNOSIS — E038 Other specified hypothyroidism: Secondary | ICD-10-CM | POA: Diagnosis not present

## 2015-02-08 DIAGNOSIS — T63001A Toxic effect of unspecified snake venom, accidental (unintentional), initial encounter: Secondary | ICD-10-CM

## 2015-02-08 DIAGNOSIS — T63061A Toxic effect of venom of other North and South American snake, accidental (unintentional), initial encounter: Secondary | ICD-10-CM | POA: Diagnosis present

## 2015-02-08 LAB — CBC WITH DIFFERENTIAL/PLATELET
BASOS ABS: 0 10*3/uL (ref 0.0–0.1)
BASOS PCT: 0 %
Basophils Absolute: 0 10*3/uL (ref 0.0–0.1)
Basophils Relative: 0 %
EOS PCT: 1 %
EOS PCT: 2 %
Eosinophils Absolute: 0.1 10*3/uL (ref 0.0–0.7)
Eosinophils Absolute: 0.1 10*3/uL (ref 0.0–0.7)
HCT: 36.5 % (ref 36.0–46.0)
HCT: 38.6 % (ref 36.0–46.0)
Hemoglobin: 11.9 g/dL — ABNORMAL LOW (ref 12.0–15.0)
Hemoglobin: 12.8 g/dL (ref 12.0–15.0)
LYMPHS ABS: 1.7 10*3/uL (ref 0.7–4.0)
LYMPHS PCT: 25 %
Lymphocytes Relative: 19 %
Lymphs Abs: 1.2 10*3/uL (ref 0.7–4.0)
MCH: 26.4 pg (ref 26.0–34.0)
MCH: 26.8 pg (ref 26.0–34.0)
MCHC: 32.6 g/dL (ref 30.0–36.0)
MCHC: 33.2 g/dL (ref 30.0–36.0)
MCV: 80.9 fL (ref 78.0–100.0)
MCV: 80.9 fL (ref 78.0–100.0)
MONO ABS: 0.4 10*3/uL (ref 0.1–1.0)
MONO ABS: 0.5 10*3/uL (ref 0.1–1.0)
Monocytes Relative: 7 %
Monocytes Relative: 8 %
Neutro Abs: 4.4 10*3/uL (ref 1.7–7.7)
Neutro Abs: 4.5 10*3/uL (ref 1.7–7.7)
Neutrophils Relative %: 66 %
Neutrophils Relative %: 72 %
PLATELETS: 221 10*3/uL (ref 150–400)
PLATELETS: 257 10*3/uL (ref 150–400)
RBC: 4.51 MIL/uL (ref 3.87–5.11)
RBC: 4.77 MIL/uL (ref 3.87–5.11)
RDW: 13.9 % (ref 11.5–15.5)
RDW: 14 % (ref 11.5–15.5)
WBC: 6.2 10*3/uL (ref 4.0–10.5)
WBC: 6.6 10*3/uL (ref 4.0–10.5)

## 2015-02-08 LAB — COMPREHENSIVE METABOLIC PANEL
ALBUMIN: 3.6 g/dL (ref 3.5–5.0)
ALT: 16 U/L (ref 14–54)
AST: 31 U/L (ref 15–41)
Alkaline Phosphatase: 66 U/L (ref 38–126)
Anion gap: 11 (ref 5–15)
BUN: 16 mg/dL (ref 6–20)
CALCIUM: 8.9 mg/dL (ref 8.9–10.3)
CHLORIDE: 104 mmol/L (ref 101–111)
CO2: 21 mmol/L — ABNORMAL LOW (ref 22–32)
Creatinine, Ser: 0.9 mg/dL (ref 0.44–1.00)
GFR calc Af Amer: >60 mL/min (ref >60)
GFR calc non Af Amer: >60 mL/min (ref >60)
GLUCOSE: 140 mg/dL — AB (ref 65–99)
POTASSIUM: 3.8 mmol/L (ref 3.5–5.1)
SODIUM: 136 mmol/L (ref 135–145)
TOTAL PROTEIN: 6.5 g/dL (ref 6.5–8.1)
Total Bilirubin: 0.8 mg/dL (ref 0.3–1.2)

## 2015-02-08 LAB — PROTIME-INR
INR: 1.11 (ref 0.00–1.49)
PROTHROMBIN TIME: 14.5 s (ref 11.6–15.2)

## 2015-02-08 LAB — FIBRINOGEN: Fibrinogen: 290 mg/dL (ref 204–475)

## 2015-02-08 MED ORDER — DIPHENHYDRAMINE HCL 25 MG PO CAPS
25.0000 mg | ORAL_CAPSULE | Freq: Four times a day (QID) | ORAL | Status: DC | PRN
  Administered 2015-02-08 (×2): 25 mg via ORAL
  Filled 2015-02-08 (×3): qty 1

## 2015-02-08 MED ORDER — HYDROMORPHONE HCL 1 MG/ML IJ SOLN
1.0000 mg | INTRAMUSCULAR | Status: AC | PRN
  Administered 2015-02-08 (×3): 1 mg via INTRAVENOUS
  Filled 2015-02-08 (×4): qty 1

## 2015-02-08 MED ORDER — HYDROMORPHONE HCL 1 MG/ML IJ SOLN
0.5000 mg | Freq: Once | INTRAMUSCULAR | Status: AC
  Administered 2015-02-08: 0.5 mg via INTRAVENOUS
  Filled 2015-02-08: qty 1

## 2015-02-08 MED ORDER — ONDANSETRON HCL 4 MG/2ML IJ SOLN
4.0000 mg | Freq: Three times a day (TID) | INTRAMUSCULAR | Status: AC | PRN
  Administered 2015-02-08: 4 mg via INTRAVENOUS
  Filled 2015-02-08: qty 2

## 2015-02-08 NOTE — Progress Notes (Signed)
Pt c/o moderate itching after taking Dilaudid IV for pain. States itching on back, ears, and face. No rash or redness present. Requesting Benadryl and also wants to continue Dilaudid as it relieves pain per pt statement. MD notified. Also informed of increased swelling in left arm. PRN Zofran given for c/o feeling sick on stomach. Follow up continued.

## 2015-02-08 NOTE — Progress Notes (Signed)
  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 Feels better today, swelling and redness much improved.  Assessment/Plan: Active Problems:   Snake bite   Snakebite Snake bite secondary to copperhead snake bite, bite site is the left index finger. Had some swelling and redness yesterday, got CroFab. Admitted to the hospital for observation. Swelling is improved, hold on CroFab if swelling worsens wall repeat CroFab was improved  Code Status: Full Code Family Communication: Plan discussed with the patient. Disposition Plan: Remains inpatient Diet: Diet regular Room service appropriate?: Yes; Fluid consistency:: Thin  Consultants:  None  Procedures:  None  Antibiotics:  None (indicate start date, and stop date if known)   Objective: Filed Vitals:   02/08/15 0555  BP: 115/85  Pulse: 82  Temp: 98.2 F (36.8 C)  Resp: 17    Intake/Output Summary (Last 24 hours) at 02/08/15 1317 Last data filed at 02/08/15 0859  Gross per 24 hour  Intake    270 ml  Output      0 ml  Net    270 ml   There were no vitals filed for this visit.  Exam: General: Alert and awake, oriented x3, not in any acute distress. HEENT: anicteric sclera, pupils reactive to light and accommodation, EOMI CVS: S1-S2 clear, no murmur rubs or gallops Chest: clear to auscultation bilaterally, no wheezing, rales or rhonchi Abdomen: soft nontender, nondistended, normal bowel sounds, no organomegaly Extremities: no cyanosis, clubbing or edema noted bilaterally Neuro: Cranial nerves II-XII intact, no focal neurological deficits  Data Reviewed: Basic Metabolic Panel:  Recent Labs Lab 02/07/15 2122 02/08/15 0531  NA 135 136  K 3.8 3.8  CL 101 104  CO2 25 21*  GLUCOSE 104* 140*  BUN 17 16  CREATININE 0.79 0.90  CALCIUM 9.5 8.9   Liver Function Tests:  Recent  Labs Lab 02/08/15 0531  AST 31  ALT 16  ALKPHOS 66  BILITOT 0.8  PROT 6.5  ALBUMIN 3.6   No results for input(s): LIPASE, AMYLASE in the last 168 hours. No results for input(s): AMMONIA in the last 168 hours. CBC:  Recent Labs Lab 02/07/15 2122 02/07/15 2352 02/08/15 0531  WBC 5.6 6.6 6.2  NEUTROABS 2.4 4.4 4.5  HGB 13.4 12.8 11.9*  HCT 40.5 38.6 36.5  MCV 81.3 80.9 80.9  PLT 269 257 221   Cardiac Enzymes: No results for input(s): CKTOTAL, CKMB, CKMBINDEX, TROPONINI in the last 168 hours. BNP (last 3 results) No results for input(s): BNP in the last 8760 hours.  ProBNP (last 3 results) No results for input(s): PROBNP in the last 8760 hours.  CBG: No results for input(s): GLUCAP in the last 168 hours.  Micro No results found for this or any previous visit (from the past 240 hour(s)).   Studies: No results found.  Scheduled Meds: . levothyroxine  100 mcg Oral QAC breakfast   Continuous Infusions:      Time spent: 35 minutes    [facility] A  [facility] Pager [phone] If 7PM-7AM, please contact night-coverage at [email or web address], password [facility] 02/08/2015, 1:17 PM

## 2015-02-09 DIAGNOSIS — E038 Other specified hypothyroidism: Secondary | ICD-10-CM

## 2015-02-09 DIAGNOSIS — T63001D Toxic effect of unspecified snake venom, accidental (unintentional), subsequent encounter: Secondary | ICD-10-CM

## 2015-02-09 NOTE — Progress Notes (Signed)
Orthopedic Tech Progress Note Patient Details:  Cheryl Odom 08-Jun-1985 [id number]  Ortho Devices Type of Ortho Device: Arm sling Ortho Device/Splint Interventions: Application   [place] Pink 02/09/2015, 11:17 AM

## 2015-02-09 NOTE — Progress Notes (Signed)
Reviewed discharge paperwork with pt.  Pt requested a work note because she was concerned about working while her arm was swollen.  Paged Dr. Hartford Poli.  Pt stated "I will not be able to stay for the note, but I will come back to get it."  Pt escorted to discharge location.

## 2015-02-09 NOTE — Discharge Summary (Signed)
Physician Discharge Summary  Cheryl Odom [id number]:[id number] DOB: 1986-01-25 DOA: 02/07/2015  PCP: Rachell Cipro, MD  Admit date: 02/07/2015 Discharge date: 02/09/2015  Time spent: 25 minutes  Recommendations for Outpatient Follow-up:  1. Follow-up with primary care physician within one week.  Discharge Diagnoses:  Principal Problem:   Snake bite Active Problems:   Hypothyroidism   Discharge Condition:  Stable  Diet recommendation:  Regular  Filed Weights   02/08/15 1715  Weight: 73.7 kg (162 lb 7.7 oz)    History of present illness:  Patient is a 29 year old female with history of hypothyroidism who was bitten by a copperhead snake. She immediately started getting swelling in her left hand that has been slowly progressing down to her left wrist. She has no other complaints.   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 When she was evaluated in the ED, because of redness and swelling 4 vials of CroFab given. Admitted to the hospital for observation. Swelling and redness has improved while she was in the hospital, CroFab held. Observed for 24 hours after CroFab, no evidence of reaction. Swelling and redness improving. Advised to keep the left hand elevated and ice packs as needed.  Hypothyroidism Synthroid continued throughout the hospital stay.   Procedures:   None  Consultations:   None  Discharge Exam: Filed Vitals:   02/09/15 0631  BP: 115/68  Pulse: 78  Temp: 97.8 F (36.6 C)  Resp: 18   General: Alert and awake, oriented x3, not in any acute distress. HEENT: anicteric sclera, pupils reactive to light and accommodation, EOMI CVS: S1-S2 clear, no murmur rubs or gallops Chest: clear to auscultation bilaterally, no wheezing, rales or rhonchi Abdomen: soft nontender, nondistended, normal bowel sounds, no  organomegaly Extremities: no cyanosis, clubbing or edema noted bilaterally Neuro: Cranial nerves II-XII intact, no focal neurological deficits  Discharge Instructions   Discharge Instructions    Increase activity slowly    Complete by:  As directed           Current Discharge Medication List    CONTINUE these medications which have NOT CHANGED   Details  levothyroxine (SYNTHROID) 100 MCG tablet Take 1 tablet (100 mcg total) by mouth daily. Qty: 90 tablet, Refills: 2    omeprazole (PRILOSEC) 20 MG capsule Take 1 capsule (20 mg total) by mouth daily. Qty: 30 capsule, Refills: 3   Associated Diagnoses: Gastroesophageal reflux disease, esophagitis presence not specified    ranitidine (ZANTAC) 150 MG tablet Take 1 tablet (150 mg total) by mouth 2 (two) times daily. Qty: 60 tablet, Refills: 2   Associated Diagnoses: Gastroesophageal reflux disease, esophagitis presence not specified       No Known Allergies Follow-up Information    Follow up with [facility], MD In 1 week.   Specialty:  Family Medicine   Contact information:   [address] [place] [address] [phone]        The results of significant diagnostics from this hospitalization (including imaging, microbiology, ancillary and laboratory) are listed below for reference.    Significant Diagnostic Studies: No results found.  Microbiology: No results found for this or any previous visit (from the past 240 hour(s)).   Labs: Basic Metabolic Panel:  Recent Labs Lab 02/07/15 2122 02/08/15 0531  NA 135 136  K 3.8 3.8  CL 101 104  CO2 25 21*  GLUCOSE 104* 140*  BUN 17 16  CREATININE 0.79 0.90  CALCIUM 9.5 8.9   Liver Function Tests:  Recent Labs Lab 02/08/15 0531  AST 31  ALT 16  ALKPHOS 66  BILITOT 0.8  PROT 6.5  ALBUMIN 3.6   No results for input(s): LIPASE, AMYLASE in the last 168 hours. No results for input(s): AMMONIA in the last 168 hours. CBC:  Recent Labs Lab  02/07/15 2122 02/07/15 2352 02/08/15 0531  WBC 5.6 6.6 6.2  NEUTROABS 2.4 4.4 4.5  HGB 13.4 12.8 11.9*  HCT 40.5 38.6 36.5  MCV 81.3 80.9 80.9  PLT 269 257 221   Cardiac Enzymes: No results for input(s): CKTOTAL, CKMB, CKMBINDEX, TROPONINI in the last 168 hours. BNP: BNP (last 3 results) No results for input(s): BNP in the last 8760 hours.  ProBNP (last 3 results) No results for input(s): PROBNP in the last 8760 hours.  CBG: No results for input(s): GLUCAP in the last 168 hours.     Signed:  Emrah Ariola A  [facility] 02/09/2015, 9:56 AM

## 2015-02-21 ENCOUNTER — Other Ambulatory Visit: Payer: Self-pay | Admitting: *Deleted

## 2015-02-21 MED ORDER — LEVOTHYROXINE SODIUM 100 MCG PO TABS: 100.0000 ug | ORAL_TABLET | Freq: Every day | ORAL | Status: DC

## 2015-02-21 MED ORDER — LEVOTHYROXINE SODIUM 100 MCG PO TABS: 100.0000 ug | ORAL_TABLET | Freq: Every day | ORAL | Status: AC

## 2015-04-19 ENCOUNTER — Telehealth: Payer: 59 | Admitting: Internal Medicine

## 2015-04-19 DIAGNOSIS — N3000 Acute cystitis without hematuria: Secondary | ICD-10-CM | POA: Diagnosis not present

## 2015-04-19 MED ORDER — PHENAZOPYRIDINE HCL 95 MG PO TABS: 95.0000 mg | ORAL_TABLET | Freq: Three times a day (TID) | ORAL | Status: AC | PRN

## 2015-04-19 MED ORDER — NITROFURANTOIN MONOHYD MACRO 100 MG PO CAPS: 100.0000 mg | ORAL_CAPSULE | Freq: Two times a day (BID) | ORAL | Status: DC

## 2015-04-19 NOTE — Progress Notes (Signed)
We are sorry that you are not feeling well.  Here is how we plan to help!  Based on what you shared with me it looks like you most likely have a simple urinary tract infection.  A UTI (Urinary Tract Infection) is a bacterial infection of the bladder.  Most cases of urinary tract infections are simple to treat but a key part of your care is to encourage you to drink plenty of fluids and watch your symptoms carefully.  I have prescribed MacroBid 100 mg twice a day for 5 days.  You will also get a prescription for pyridium, a urinary analgesic. Your symptoms should gradually improve. Call us if the burning in your urine worsens, you develop worsening fever, back pain or pelvic pain or if your symptoms do not resolve after completing the antibiotic. The next time you get a UTI it would be beneficial for you to be evaluated by a urologist since you are getting them frequently.  Urinary tract infections can be prevented by drinking plenty of water to keep your body hydrated.  Also be sure when you wipe, wipe from front to back and don't hold it in!  If possible, empty your bladder every 4 hours.  Your e-visit answers were reviewed by a board certified advanced clinical practitioner to complete your personal care plan.  Depending on the condition, your plan could have included both over the counter or prescription medications.  If there is a problem please reply  once you have received a response from your provider.  Your safety is important to [place].  If you have drug allergies check your prescription carefully.    You can use MyChart to ask questions about today's visit, request a non-urgent call back, or ask for a work or school excuse for 24 hours related to this e-Visit. If it has been greater than 24 hours you will need to follow up with your provider, or enter a new e-Visit to address those concerns.   You will get an e-mail in the next two days asking about your experience.  I hope that your  e-visit has been valuable and will speed your recovery. Thank you for using e-visits.

## 2015-04-23 ENCOUNTER — Telehealth: Payer: Self-pay | Admitting: *Deleted

## 2015-04-23 NOTE — Telephone Encounter (Signed)
Received voicemail from 12/23 from [facility] that a rx was sent in electronically for Pyridum 95 mg.  Pharmacy does not have dosage.  They have 100 mg.  I called pharmacy this morning since I was not in the office on 04/19/15 and pharmacy states that they did not fill that rx but pt did pick up her macrobid.  Called patient to see if she still needed rx but left message for pt to call back.  If pt still needs rx is this okay to fill for 100 mg or do you want to go ahead and cancel rx?  Please advise

## 2015-05-29 DIAGNOSIS — L814 Other melanin hyperpigmentation: Secondary | ICD-10-CM | POA: Diagnosis not present

## 2015-05-29 DIAGNOSIS — D485 Neoplasm of uncertain behavior of skin: Secondary | ICD-10-CM | POA: Diagnosis not present

## 2015-05-29 DIAGNOSIS — D2371 Other benign neoplasm of skin of right lower limb, including hip: Secondary | ICD-10-CM | POA: Diagnosis not present

## 2015-05-29 DIAGNOSIS — D1801 Hemangioma of skin and subcutaneous tissue: Secondary | ICD-10-CM | POA: Diagnosis not present

## 2015-06-05 DIAGNOSIS — D2371 Other benign neoplasm of skin of right lower limb, including hip: Secondary | ICD-10-CM | POA: Diagnosis not present

## 2015-06-05 DIAGNOSIS — Z01419 Encounter for gynecological examination (general) (routine) without abnormal findings: Secondary | ICD-10-CM | POA: Diagnosis not present

## 2015-06-05 DIAGNOSIS — Z130 Encounter for screening for diseases of the blood and blood-forming organs and certain disorders involving the immune mechanism: Secondary | ICD-10-CM | POA: Diagnosis not present

## 2015-06-05 DIAGNOSIS — R8299 Other abnormal findings in urine: Secondary | ICD-10-CM | POA: Diagnosis not present

## 2015-06-05 DIAGNOSIS — Z6826 Body mass index (BMI) 26.0-26.9, adult: Secondary | ICD-10-CM | POA: Diagnosis not present

## 2015-06-27 DIAGNOSIS — J309 Allergic rhinitis, unspecified: Secondary | ICD-10-CM | POA: Diagnosis not present

## 2015-06-27 DIAGNOSIS — R591 Generalized enlarged lymph nodes: Secondary | ICD-10-CM | POA: Diagnosis not present

## 2015-06-27 DIAGNOSIS — H109 Unspecified conjunctivitis: Secondary | ICD-10-CM | POA: Diagnosis not present

## 2015-07-01 DIAGNOSIS — Z0000 Encounter for general adult medical examination without abnormal findings: Secondary | ICD-10-CM | POA: Diagnosis not present

## 2015-07-03 DIAGNOSIS — G4700 Insomnia, unspecified: Secondary | ICD-10-CM | POA: Diagnosis not present

## 2015-07-03 DIAGNOSIS — Z0000 Encounter for general adult medical examination without abnormal findings: Secondary | ICD-10-CM | POA: Diagnosis not present

## 2015-07-03 DIAGNOSIS — N943 Premenstrual tension syndrome: Secondary | ICD-10-CM | POA: Diagnosis not present

## 2015-08-05 ENCOUNTER — Other Ambulatory Visit: Payer: 59

## 2015-08-08 ENCOUNTER — Ambulatory Visit: Payer: 59 | Admitting: Endocrinology

## 2015-11-04 DIAGNOSIS — N943 Premenstrual tension syndrome: Secondary | ICD-10-CM | POA: Diagnosis not present

## 2015-11-04 DIAGNOSIS — E039 Hypothyroidism, unspecified: Secondary | ICD-10-CM | POA: Diagnosis not present

## 2015-11-04 DIAGNOSIS — J309 Allergic rhinitis, unspecified: Secondary | ICD-10-CM | POA: Diagnosis not present

## 2016-05-08 ENCOUNTER — Telehealth: Payer: 59 | Admitting: Family

## 2016-05-08 DIAGNOSIS — N390 Urinary tract infection, site not specified: Secondary | ICD-10-CM | POA: Diagnosis not present

## 2016-05-08 MED ORDER — CEPHALEXIN 500 MG PO CAPS: 500.0000 mg | ORAL_CAPSULE | Freq: Two times a day (BID) | ORAL | 0 refills | Status: DC

## 2016-05-08 NOTE — Progress Notes (Signed)

## 2016-08-24 DIAGNOSIS — Z0000 Encounter for general adult medical examination without abnormal findings: Secondary | ICD-10-CM | POA: Diagnosis not present

## 2016-08-27 DIAGNOSIS — Z0000 Encounter for general adult medical examination without abnormal findings: Secondary | ICD-10-CM | POA: Diagnosis not present

## 2016-09-22 MED FILL — NITROFURANTOIN MONO-MCR 100: 100 | 5 days supply | Qty: 10 | Fill #0

## 2016-10-08 DIAGNOSIS — Z7721 Contact with and (suspected) exposure to potentially hazardous body fluids: Secondary | ICD-10-CM | POA: Diagnosis not present

## 2016-10-08 DIAGNOSIS — E039 Hypothyroidism, unspecified: Secondary | ICD-10-CM | POA: Diagnosis not present

## 2016-10-08 DIAGNOSIS — Z6828 Body mass index (BMI) 28.0-28.9, adult: Secondary | ICD-10-CM | POA: Diagnosis not present

## 2016-10-08 DIAGNOSIS — Z139 Encounter for screening, unspecified: Secondary | ICD-10-CM | POA: Diagnosis not present

## 2016-10-12 DIAGNOSIS — Z6827 Body mass index (BMI) 27.0-27.9, adult: Secondary | ICD-10-CM | POA: Diagnosis not present

## 2016-10-12 DIAGNOSIS — Z7721 Contact with and (suspected) exposure to potentially hazardous body fluids: Secondary | ICD-10-CM | POA: Diagnosis not present

## 2016-10-12 DIAGNOSIS — E039 Hypothyroidism, unspecified: Secondary | ICD-10-CM | POA: Diagnosis not present

## 2016-11-09 DIAGNOSIS — F411 Generalized anxiety disorder: Secondary | ICD-10-CM | POA: Diagnosis not present

## 2016-11-09 DIAGNOSIS — I493 Ventricular premature depolarization: Secondary | ICD-10-CM | POA: Diagnosis not present

## 2016-11-09 DIAGNOSIS — R002 Palpitations: Secondary | ICD-10-CM | POA: Diagnosis not present

## 2016-12-15 DIAGNOSIS — F411 Generalized anxiety disorder: Secondary | ICD-10-CM | POA: Diagnosis not present

## 2016-12-15 DIAGNOSIS — E039 Hypothyroidism, unspecified: Secondary | ICD-10-CM | POA: Diagnosis not present

## 2017-01-06 DIAGNOSIS — B360 Pityriasis versicolor: Secondary | ICD-10-CM | POA: Diagnosis not present

## 2017-01-06 DIAGNOSIS — D2272 Melanocytic nevi of left lower limb, including hip: Secondary | ICD-10-CM | POA: Diagnosis not present

## 2017-01-06 DIAGNOSIS — L814 Other melanin hyperpigmentation: Secondary | ICD-10-CM | POA: Diagnosis not present

## 2017-01-06 DIAGNOSIS — D1801 Hemangioma of skin and subcutaneous tissue: Secondary | ICD-10-CM | POA: Diagnosis not present

## 2017-01-06 DIAGNOSIS — D225 Melanocytic nevi of trunk: Secondary | ICD-10-CM | POA: Diagnosis not present

## 2017-01-06 DIAGNOSIS — D2271 Melanocytic nevi of right lower limb, including hip: Secondary | ICD-10-CM | POA: Diagnosis not present

## 2017-01-06 DIAGNOSIS — Z808 Family history of malignant neoplasm of other organs or systems: Secondary | ICD-10-CM | POA: Diagnosis not present

## 2017-01-06 DIAGNOSIS — Z23 Encounter for immunization: Secondary | ICD-10-CM | POA: Diagnosis not present

## 2017-01-06 DIAGNOSIS — Z86018 Personal history of other benign neoplasm: Secondary | ICD-10-CM | POA: Diagnosis not present

## 2017-01-21 MED FILL — BUPROPION HCL XL 150 MG TAB: 150 | 20 days supply | Qty: 20 | Fill #0

## 2017-01-25 DIAGNOSIS — F411 Generalized anxiety disorder: Secondary | ICD-10-CM | POA: Diagnosis not present

## 2017-01-25 DIAGNOSIS — Z7721 Contact with and (suspected) exposure to potentially hazardous body fluids: Secondary | ICD-10-CM | POA: Diagnosis not present

## 2017-01-25 DIAGNOSIS — E039 Hypothyroidism, unspecified: Secondary | ICD-10-CM | POA: Diagnosis not present

## 2017-01-25 MED FILL — LORazepam 0.5 MG TABS: 0.5 | 15 days supply | Qty: 15 | Fill #0

## 2017-02-15 MED FILL — BUPROPION HCL XL 150 MG TAB: 150 | 90 days supply | Qty: 90 | Fill #0

## 2017-03-22 MED FILL — LEVOTHYROXINE 100 MCG TABLE: 100 | 28 days supply | Qty: 30 | Fill #0

## 2017-03-24 ENCOUNTER — Telehealth (HOSPITAL_COMMUNITY): Payer: Self-pay | Admitting: Emergency Medicine

## 2017-03-24 MED ORDER — ONDANSETRON HCL 4 MG PO TABS: 4.0000 mg | ORAL_TABLET | Freq: Four times a day (QID) | ORAL | 0 refills | Status: DC

## 2017-03-24 MED ORDER — ONDANSETRON HCL 4 MG PO TABS: 4.0000 mg | ORAL_TABLET | Freq: Three times a day (TID) | ORAL | 0 refills | Status: DC | PRN

## 2017-03-24 MED FILL — ONDANSETRON HCL 4 MG TABLET: 4 | 3 days supply | Qty: 12 | Fill #0

## 2017-03-24 NOTE — Telephone Encounter (Signed)
URI symptoms with nausea and vomiting x1 week   Zigmund Gottron, NP 03/24/2017 10:14 AM

## 2017-03-24 NOTE — Addendum Note (Signed)
Addended by: Augusto Gamble B on: 03/24/2017 10:47 AM   Modules accepted: Orders

## 2017-05-28 MED FILL — LORazepam 0.5 MG TABS: 0.5 | 15 days supply | Qty: 15 | Fill #0

## 2017-05-28 MED FILL — buPROPion HCL ER (XL) 150 M: 150 | 90 days supply | Qty: 90 | Fill #0

## 2017-08-02 MED FILL — LEVOTHYROXINE 100 MCG TABLE: 100 | 30 days supply | Qty: 30 | Fill #0

## 2017-09-07 MED FILL — ONDANSETRON HCL 4 MG TABLET: 4 | 2 days supply | Qty: 10 | Fill #0

## 2017-09-22 MED FILL — LORazepam 0.5 MG TABS: 0.5 | 15 days supply | Qty: 15 | Fill #0

## 2017-10-13 ENCOUNTER — Ambulatory Visit: Payer: No Typology Code available for payment source | Admitting: Psychology

## 2017-10-13 DIAGNOSIS — F411 Generalized anxiety disorder: Secondary | ICD-10-CM

## 2017-10-19 ENCOUNTER — Ambulatory Visit: Payer: No Typology Code available for payment source | Admitting: Psychology

## 2017-10-19 DIAGNOSIS — F411 Generalized anxiety disorder: Secondary | ICD-10-CM | POA: Diagnosis not present

## 2017-10-22 ENCOUNTER — Ambulatory Visit: Payer: No Typology Code available for payment source | Admitting: Psychology

## 2017-10-26 ENCOUNTER — Ambulatory Visit: Payer: No Typology Code available for payment source | Admitting: Psychology

## 2017-10-26 DIAGNOSIS — F411 Generalized anxiety disorder: Secondary | ICD-10-CM | POA: Diagnosis not present

## 2017-10-29 MED FILL — VYVANSE 20 MG CAPSULE: 20 | 30 days supply | Qty: 30 | Fill #0

## 2017-11-02 ENCOUNTER — Ambulatory Visit: Payer: No Typology Code available for payment source | Admitting: Psychology

## 2017-11-09 ENCOUNTER — Ambulatory Visit: Payer: No Typology Code available for payment source | Admitting: Psychology

## 2017-11-09 DIAGNOSIS — F411 Generalized anxiety disorder: Secondary | ICD-10-CM | POA: Diagnosis not present

## 2017-11-23 ENCOUNTER — Ambulatory Visit: Payer: No Typology Code available for payment source | Admitting: Psychology

## 2017-11-23 DIAGNOSIS — F411 Generalized anxiety disorder: Secondary | ICD-10-CM | POA: Diagnosis not present

## 2017-11-30 MED FILL — ADDERALL XR 20 MG CAP SA: 20 | 30 days supply | Qty: 30 | Fill #0

## 2017-12-01 ENCOUNTER — Ambulatory Visit: Payer: No Typology Code available for payment source | Admitting: Psychology

## 2017-12-01 DIAGNOSIS — F411 Generalized anxiety disorder: Secondary | ICD-10-CM | POA: Diagnosis not present

## 2017-12-14 ENCOUNTER — Ambulatory Visit: Payer: No Typology Code available for payment source | Admitting: Psychology

## 2017-12-14 DIAGNOSIS — F411 Generalized anxiety disorder: Secondary | ICD-10-CM

## 2017-12-21 ENCOUNTER — Ambulatory Visit: Payer: No Typology Code available for payment source | Admitting: Psychology

## 2017-12-22 ENCOUNTER — Ambulatory Visit: Payer: No Typology Code available for payment source | Admitting: Psychology

## 2017-12-22 DIAGNOSIS — F411 Generalized anxiety disorder: Secondary | ICD-10-CM

## 2017-12-28 ENCOUNTER — Ambulatory Visit: Payer: No Typology Code available for payment source | Admitting: Psychology

## 2017-12-28 DIAGNOSIS — F411 Generalized anxiety disorder: Secondary | ICD-10-CM | POA: Diagnosis not present

## 2017-12-31 MED FILL — ADDERALL XR 25 MG CAPSULE: 25 | 30 days supply | Qty: 30 | Fill #0

## 2018-01-05 ENCOUNTER — Ambulatory Visit (INDEPENDENT_AMBULATORY_CARE_PROVIDER_SITE_OTHER): Payer: No Typology Code available for payment source | Admitting: Psychology

## 2018-01-05 DIAGNOSIS — F411 Generalized anxiety disorder: Secondary | ICD-10-CM

## 2018-01-07 ENCOUNTER — Ambulatory Visit: Payer: Self-pay | Admitting: Psychology

## 2018-01-11 ENCOUNTER — Ambulatory Visit: Payer: No Typology Code available for payment source | Admitting: Psychology

## 2018-01-11 DIAGNOSIS — F411 Generalized anxiety disorder: Secondary | ICD-10-CM

## 2018-01-18 ENCOUNTER — Ambulatory Visit: Payer: No Typology Code available for payment source | Admitting: Psychology

## 2018-01-18 DIAGNOSIS — F411 Generalized anxiety disorder: Secondary | ICD-10-CM

## 2018-01-31 MED FILL — ADDERALL XR 25 MG CAPSULE: 25 | 30 days supply | Qty: 30 | Fill #0

## 2018-02-01 ENCOUNTER — Ambulatory Visit: Payer: No Typology Code available for payment source | Admitting: Psychology

## 2018-02-01 DIAGNOSIS — F411 Generalized anxiety disorder: Secondary | ICD-10-CM

## 2018-02-01 MED FILL — LEVOTHYROXINE 100 MCG TABLE: 100 | 90 days supply | Qty: 90 | Fill #0

## 2018-02-01 MED FILL — LORazepam 0.5 MG TABS: 0.5 | 15 days supply | Qty: 15 | Fill #0

## 2018-02-08 ENCOUNTER — Ambulatory Visit: Payer: No Typology Code available for payment source | Admitting: Psychology

## 2018-02-08 DIAGNOSIS — F411 Generalized anxiety disorder: Secondary | ICD-10-CM | POA: Diagnosis not present

## 2018-02-16 ENCOUNTER — Ambulatory Visit: Payer: No Typology Code available for payment source | Admitting: Psychology

## 2018-02-16 DIAGNOSIS — F411 Generalized anxiety disorder: Secondary | ICD-10-CM

## 2018-03-01 ENCOUNTER — Ambulatory Visit: Payer: No Typology Code available for payment source | Admitting: Psychology

## 2018-03-01 DIAGNOSIS — F411 Generalized anxiety disorder: Secondary | ICD-10-CM

## 2018-03-01 MED FILL — ADDERALL XR 25 MG CAPSULE: 25 | 30 days supply | Qty: 30 | Fill #0

## 2018-03-08 ENCOUNTER — Ambulatory Visit: Payer: No Typology Code available for payment source | Admitting: Psychology

## 2018-03-08 DIAGNOSIS — F411 Generalized anxiety disorder: Secondary | ICD-10-CM | POA: Diagnosis not present

## 2018-03-17 MED FILL — buPROPion HCL ER (XL) 150 M: 150 | 90 days supply | Qty: 90 | Fill #0

## 2018-03-22 ENCOUNTER — Ambulatory Visit: Payer: No Typology Code available for payment source | Admitting: Psychology

## 2018-03-23 MED FILL — METOPROLOL SUCCINATE ER 25: 25 | 30 days supply | Qty: 30 | Fill #0

## 2018-04-04 MED FILL — VERAPAMIL ER 180 MG CAPSULE: 180 | 30 days supply | Qty: 30 | Fill #0

## 2018-04-06 ENCOUNTER — Ambulatory Visit: Payer: No Typology Code available for payment source | Admitting: Psychology

## 2018-04-06 DIAGNOSIS — F411 Generalized anxiety disorder: Secondary | ICD-10-CM | POA: Diagnosis not present

## 2018-04-12 MED FILL — ATENOLOL 50 MG TABLET: 50 | 30 days supply | Qty: 60 | Fill #0

## 2018-04-14 MED FILL — ADDERALL XR 25 MG CAPSULE: 25 | 30 days supply | Qty: 30 | Fill #0

## 2018-04-26 ENCOUNTER — Ambulatory Visit: Payer: No Typology Code available for payment source | Admitting: Psychology

## 2018-04-26 DIAGNOSIS — F411 Generalized anxiety disorder: Secondary | ICD-10-CM | POA: Diagnosis not present

## 2018-05-09 ENCOUNTER — Ambulatory Visit: Payer: No Typology Code available for payment source | Admitting: Psychology

## 2018-05-09 DIAGNOSIS — F411 Generalized anxiety disorder: Secondary | ICD-10-CM | POA: Diagnosis not present

## 2018-05-12 MED FILL — ATENOLOL 50 MG TABLET: 50 | 90 days supply | Qty: 90 | Fill #0

## 2018-05-12 MED FILL — QUINAPRIL 20 MG TABLET: 20 | 30 days supply | Qty: 30 | Fill #0

## 2018-05-17 ENCOUNTER — Ambulatory Visit: Payer: No Typology Code available for payment source | Admitting: Psychology

## 2018-05-17 DIAGNOSIS — F411 Generalized anxiety disorder: Secondary | ICD-10-CM

## 2018-05-23 MED FILL — CONCERTA 18 MG TABLET ER: 18 | 30 days supply | Qty: 30 | Fill #0

## 2018-05-27 ENCOUNTER — Other Ambulatory Visit: Payer: Self-pay | Admitting: Cardiology

## 2018-05-28 LAB — BASIC METABOLIC PANEL
BUN/Creatinine Ratio: 15 (ref 9–23)
BUN: 17 mg/dL (ref 6–20)
CALCIUM: 9.5 mg/dL (ref 8.7–10.2)
CO2: 23 mmol/L (ref 20–29)
CREATININE: 1.12 mg/dL — AB (ref 0.57–1.00)
Chloride: 103 mmol/L (ref 96–106)
GFR calc Af Amer: 76 mL/min/{1.73_m2} (ref >59)
GFR, EST NON AFRICAN AMERICAN: 66 mL/min/{1.73_m2} (ref >59)
Glucose: 95 mg/dL (ref 65–99)
POTASSIUM: 4.4 mmol/L (ref 3.5–5.2)
Sodium: 142 mmol/L (ref 134–144)

## 2018-05-30 ENCOUNTER — Ambulatory Visit: Payer: No Typology Code available for payment source | Admitting: Psychology

## 2018-05-30 MED FILL — buPROPion HCL ER (XL) 150 M: 150 | 90 days supply | Qty: 90 | Fill #1

## 2018-05-30 MED FILL — LEVOTHYROXINE 100 MCG TABLE: 100 | 90 days supply | Qty: 90 | Fill #1

## 2018-06-07 MED FILL — QUINAPRIL 20 MG TABLET: 20 | 30 days supply | Qty: 30 | Fill #1

## 2018-06-14 MED FILL — CONCERTA 36 MG TABLET ER: 36 | 30 days supply | Qty: 30 | Fill #0

## 2018-06-20 MED FILL — LORazepam 0.5 MG TABS: 0.5 | 15 days supply | Qty: 15 | Fill #0

## 2018-06-21 MED FILL — ADDERALL XR 25 MG CAPSULE: 25 | 30 days supply | Qty: 30 | Fill #0

## 2018-06-22 ENCOUNTER — Ambulatory Visit: Payer: No Typology Code available for payment source | Admitting: Psychology

## 2018-06-22 DIAGNOSIS — F411 Generalized anxiety disorder: Secondary | ICD-10-CM

## 2018-07-07 ENCOUNTER — Other Ambulatory Visit: Payer: Self-pay

## 2018-07-07 ENCOUNTER — Ambulatory Visit: Payer: No Typology Code available for payment source | Admitting: Psychology

## 2018-07-07 DIAGNOSIS — F411 Generalized anxiety disorder: Secondary | ICD-10-CM

## 2018-07-11 MED FILL — QUINAPRIL 20 MG TABLET: 20 | 30 days supply | Qty: 30 | Fill #2

## 2018-07-18 ENCOUNTER — Ambulatory Visit (INDEPENDENT_AMBULATORY_CARE_PROVIDER_SITE_OTHER): Payer: No Typology Code available for payment source | Admitting: Psychology

## 2018-07-18 DIAGNOSIS — F411 Generalized anxiety disorder: Secondary | ICD-10-CM

## 2018-07-19 MED FILL — ADDERALL XR 25 MG CAPSULE: 25 | 30 days supply | Qty: 30 | Fill #0

## 2018-07-28 ENCOUNTER — Ambulatory Visit (INDEPENDENT_AMBULATORY_CARE_PROVIDER_SITE_OTHER): Payer: No Typology Code available for payment source | Admitting: Psychology

## 2018-07-28 DIAGNOSIS — F411 Generalized anxiety disorder: Secondary | ICD-10-CM

## 2018-08-01 ENCOUNTER — Other Ambulatory Visit: Payer: Self-pay | Admitting: Cardiology

## 2018-08-01 MED FILL — QUINAPRIL 20 MG TABLET: 20 | 30 days supply | Qty: 30 | Fill #0

## 2018-08-12 ENCOUNTER — Ambulatory Visit: Payer: No Typology Code available for payment source | Admitting: Psychology

## 2018-08-17 MED FILL — ADDERALL XR 25 MG CAPSULE: 25 | 30 days supply | Qty: 30 | Fill #0

## 2018-08-19 ENCOUNTER — Ambulatory Visit (INDEPENDENT_AMBULATORY_CARE_PROVIDER_SITE_OTHER): Payer: No Typology Code available for payment source | Admitting: Psychology

## 2018-08-19 DIAGNOSIS — F411 Generalized anxiety disorder: Secondary | ICD-10-CM

## 2018-08-26 ENCOUNTER — Ambulatory Visit (INDEPENDENT_AMBULATORY_CARE_PROVIDER_SITE_OTHER): Payer: No Typology Code available for payment source | Admitting: Psychology

## 2018-08-26 DIAGNOSIS — F411 Generalized anxiety disorder: Secondary | ICD-10-CM | POA: Diagnosis not present

## 2018-08-31 ENCOUNTER — Ambulatory Visit (INDEPENDENT_AMBULATORY_CARE_PROVIDER_SITE_OTHER): Payer: No Typology Code available for payment source | Admitting: Psychology

## 2018-08-31 DIAGNOSIS — F411 Generalized anxiety disorder: Secondary | ICD-10-CM

## 2018-08-31 MED FILL — LEVOTHYROXINE 100 MCG TAB: 100 | 90 days supply | Qty: 90 | Fill #0

## 2018-08-31 MED FILL — buPROPion HCL ER (XL) 150 M: 150 | 90 days supply | Qty: 90 | Fill #0

## 2018-08-31 MED FILL — QUINAPRIL 20 MG TABLET: 20 | 30 days supply | Qty: 30 | Fill #1

## 2018-08-31 MED FILL — ATENOLOL 50 MG TABLET: 50 | 90 days supply | Qty: 90 | Fill #0

## 2018-09-07 ENCOUNTER — Ambulatory Visit (INDEPENDENT_AMBULATORY_CARE_PROVIDER_SITE_OTHER): Payer: No Typology Code available for payment source | Admitting: Psychology

## 2018-09-07 DIAGNOSIS — F411 Generalized anxiety disorder: Secondary | ICD-10-CM

## 2018-09-13 ENCOUNTER — Ambulatory Visit: Payer: No Typology Code available for payment source | Admitting: Psychology

## 2018-09-15 ENCOUNTER — Ambulatory Visit (INDEPENDENT_AMBULATORY_CARE_PROVIDER_SITE_OTHER): Payer: No Typology Code available for payment source | Admitting: Psychology

## 2018-09-15 DIAGNOSIS — F411 Generalized anxiety disorder: Secondary | ICD-10-CM

## 2018-09-20 MED FILL — ADDERALL XR 25 MG CAPSULE: 25 | 30 days supply | Qty: 30 | Fill #0

## 2018-09-21 ENCOUNTER — Ambulatory Visit (INDEPENDENT_AMBULATORY_CARE_PROVIDER_SITE_OTHER): Payer: No Typology Code available for payment source | Admitting: Psychology

## 2018-09-21 DIAGNOSIS — F411 Generalized anxiety disorder: Secondary | ICD-10-CM | POA: Diagnosis not present

## 2018-09-27 ENCOUNTER — Other Ambulatory Visit: Payer: Self-pay

## 2018-09-27 ENCOUNTER — Other Ambulatory Visit: Payer: Self-pay | Admitting: Family Medicine

## 2018-09-27 ENCOUNTER — Telehealth: Payer: Self-pay

## 2018-09-27 DIAGNOSIS — R6889 Other general symptoms and signs: Secondary | ICD-10-CM

## 2018-09-27 MED FILL — LEVOTHYROXINE 112 MCG TAB: 112 | 30 days supply | Qty: 30 | Fill #0
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
## 2018-09-28 ENCOUNTER — Telehealth (HOSPITAL_COMMUNITY): Payer: Self-pay

## 2018-09-28 MED ORDER — ONDANSETRON HCL 4 MG PO TABS: 4.0000 mg | ORAL_TABLET | Freq: Four times a day (QID) | ORAL | 0 refills | Status: DC

## 2018-10-04 ENCOUNTER — Ambulatory Visit (INDEPENDENT_AMBULATORY_CARE_PROVIDER_SITE_OTHER): Payer: No Typology Code available for payment source | Admitting: Psychology

## 2018-10-04 DIAGNOSIS — F411 Generalized anxiety disorder: Secondary | ICD-10-CM

## 2018-10-11 ENCOUNTER — Ambulatory Visit (INDEPENDENT_AMBULATORY_CARE_PROVIDER_SITE_OTHER): Payer: No Typology Code available for payment source | Admitting: Psychology

## 2018-10-11 DIAGNOSIS — F411 Generalized anxiety disorder: Secondary | ICD-10-CM

## 2018-10-13 MED FILL — QUINAPRIL 20 MG TABLET: 20 | 30 days supply | Qty: 30 | Fill #2

## 2018-10-17 MED FILL — traZODone HCL 50 MG TABS: 50 | 30 days supply | Qty: 30 | Fill #0

## 2018-10-18 ENCOUNTER — Ambulatory Visit (INDEPENDENT_AMBULATORY_CARE_PROVIDER_SITE_OTHER): Payer: No Typology Code available for payment source | Admitting: Psychology

## 2018-10-18 ENCOUNTER — Ambulatory Visit: Payer: No Typology Code available for payment source | Admitting: Psychology

## 2018-10-18 DIAGNOSIS — F411 Generalized anxiety disorder: Secondary | ICD-10-CM | POA: Diagnosis not present

## 2018-10-20 MED FILL — ADDERALL XR 25 MG CAPSULE: 25 | 30 days supply | Qty: 30 | Fill #0

## 2018-10-25 ENCOUNTER — Ambulatory Visit: Payer: No Typology Code available for payment source | Admitting: Psychology

## 2018-11-03 MED FILL — LEVOTHYROXINE 112 MCG TAB: 112 | 30 days supply | Qty: 30 | Fill #1

## 2018-11-10 ENCOUNTER — Ambulatory Visit (INDEPENDENT_AMBULATORY_CARE_PROVIDER_SITE_OTHER): Payer: No Typology Code available for payment source | Admitting: Psychology

## 2018-11-10 DIAGNOSIS — F411 Generalized anxiety disorder: Secondary | ICD-10-CM | POA: Diagnosis not present

## 2018-11-14 ENCOUNTER — Other Ambulatory Visit: Payer: Self-pay | Admitting: Cardiology

## 2018-11-14 NOTE — Telephone Encounter (Signed)
Please fill if necessary

## 2018-11-15 ENCOUNTER — Ambulatory Visit: Payer: No Typology Code available for payment source | Admitting: Psychology

## 2018-11-18 ENCOUNTER — Other Ambulatory Visit: Payer: Self-pay | Admitting: Cardiology

## 2018-11-18 MED FILL — traZODone HCL 100 MG TABS: 100 | 30 days supply | Qty: 30 | Fill #0

## 2018-11-21 ENCOUNTER — Other Ambulatory Visit: Payer: Self-pay | Admitting: Cardiology

## 2018-11-21 MED FILL — QUINAPRIL 20 MG TABLET: 20 | 30 days supply | Qty: 30 | Fill #0

## 2018-11-21 MED FILL — ADDERALL XR 25 MG CAPSULE: 25 | 30 days supply | Qty: 30 | Fill #0

## 2018-12-02 MED FILL — LEVOTHYROXINE 112 MCG TAB: 112 | 30 days supply | Qty: 30 | Fill #0

## 2018-12-07 MED FILL — METOPROLOL SUCCINATE ER 50: 50 | 30 days supply | Qty: 30 | Fill #0

## 2018-12-08 MED FILL — ATENOLOL 50 MG TABLET: 50 | 90 days supply | Qty: 90 | Fill #1

## 2018-12-08 MED FILL — buPROPion HCL ER (XL) 150 M: 150 | 90 days supply | Qty: 90 | Fill #0

## 2018-12-19 MED FILL — ADDERALL XR 25 MG CAPSULE: 25 | 30 days supply | Qty: 30 | Fill #0

## 2018-12-30 MED FILL — LEVOTHYROXINE 112 MCG TAB: 112 | 30 days supply | Qty: 30 | Fill #1

## 2019-01-03 ENCOUNTER — Ambulatory Visit (INDEPENDENT_AMBULATORY_CARE_PROVIDER_SITE_OTHER): Payer: No Typology Code available for payment source | Admitting: Psychology

## 2019-01-03 DIAGNOSIS — F411 Generalized anxiety disorder: Secondary | ICD-10-CM | POA: Diagnosis not present

## 2019-01-04 MED FILL — traZODone HCL 100 MG TABS: 100 | 30 days supply | Qty: 30 | Fill #0

## 2019-01-10 ENCOUNTER — Ambulatory Visit (INDEPENDENT_AMBULATORY_CARE_PROVIDER_SITE_OTHER): Payer: No Typology Code available for payment source | Admitting: Psychology

## 2019-01-10 DIAGNOSIS — F411 Generalized anxiety disorder: Secondary | ICD-10-CM

## 2019-01-17 ENCOUNTER — Ambulatory Visit (INDEPENDENT_AMBULATORY_CARE_PROVIDER_SITE_OTHER): Payer: No Typology Code available for payment source | Admitting: Psychology

## 2019-01-17 DIAGNOSIS — F411 Generalized anxiety disorder: Secondary | ICD-10-CM | POA: Diagnosis not present

## 2019-01-17 MED FILL — ADDERALL XR 25 MG CAPSULE: 25 | 30 days supply | Qty: 30 | Fill #0

## 2019-01-20 ENCOUNTER — Ambulatory Visit (INDEPENDENT_AMBULATORY_CARE_PROVIDER_SITE_OTHER): Payer: No Typology Code available for payment source | Admitting: Psychology

## 2019-01-20 DIAGNOSIS — F411 Generalized anxiety disorder: Secondary | ICD-10-CM

## 2019-01-31 ENCOUNTER — Ambulatory Visit (INDEPENDENT_AMBULATORY_CARE_PROVIDER_SITE_OTHER): Payer: No Typology Code available for payment source | Admitting: Psychology

## 2019-01-31 DIAGNOSIS — F411 Generalized anxiety disorder: Secondary | ICD-10-CM | POA: Diagnosis not present

## 2019-02-03 MED FILL — LEVOTHYROXINE 112 MCG TAB: 112 | 30 days supply | Qty: 30 | Fill #0

## 2019-02-10 ENCOUNTER — Other Ambulatory Visit (HOSPITAL_COMMUNITY)
Admission: RE | Admit: 2019-02-10 | Discharge: 2019-02-10 | Disposition: A | Discharge status: Home / Self Care | Payer: No Typology Code available for payment source | Source: Ambulatory Visit | Attending: Family Medicine | Admitting: Family Medicine

## 2019-02-10 ENCOUNTER — Other Ambulatory Visit: Payer: Self-pay | Admitting: Family Medicine

## 2019-02-10 DIAGNOSIS — Z0000 Encounter for general adult medical examination without abnormal findings: Secondary | ICD-10-CM | POA: Diagnosis not present

## 2019-02-15 LAB — CYTOLOGY - PAP
Chlamydia: NEGATIVE
Comment: NEGATIVE
Comment: NEGATIVE
Comment: NORMAL
Diagnosis: NEGATIVE
Neisseria Gonorrhea: NEGATIVE
Trichomonas: NEGATIVE

## 2019-02-16 ENCOUNTER — Ambulatory Visit (INDEPENDENT_AMBULATORY_CARE_PROVIDER_SITE_OTHER): Payer: No Typology Code available for payment source | Admitting: Psychology

## 2019-02-16 DIAGNOSIS — F411 Generalized anxiety disorder: Secondary | ICD-10-CM | POA: Diagnosis not present

## 2019-02-20 MED FILL — traZODone HCL 100 MG TABS: 100 | 30 days supply | Qty: 30 | Fill #0

## 2019-02-23 MED FILL — ADDERALL XR 25 MG CAPSULE: 25 | 30 days supply | Qty: 30 | Fill #0

## 2019-03-02 ENCOUNTER — Ambulatory Visit (INDEPENDENT_AMBULATORY_CARE_PROVIDER_SITE_OTHER): Payer: No Typology Code available for payment source | Admitting: Psychology

## 2019-03-02 DIAGNOSIS — F411 Generalized anxiety disorder: Secondary | ICD-10-CM | POA: Diagnosis not present

## 2019-03-06 MED FILL — LEVOTHYROXINE 112 MCG TAB: 112 | 30 days supply | Qty: 30 | Fill #0

## 2019-03-08 ENCOUNTER — Ambulatory Visit (INDEPENDENT_AMBULATORY_CARE_PROVIDER_SITE_OTHER): Payer: No Typology Code available for payment source | Admitting: Psychology

## 2019-03-08 DIAGNOSIS — F411 Generalized anxiety disorder: Secondary | ICD-10-CM

## 2019-03-15 MED FILL — buPROPion HCL ER (XL) 150 M: 150 | 30 days supply | Qty: 30 | Fill #0

## 2019-03-16 ENCOUNTER — Ambulatory Visit (INDEPENDENT_AMBULATORY_CARE_PROVIDER_SITE_OTHER): Payer: No Typology Code available for payment source | Admitting: Psychology

## 2019-03-16 DIAGNOSIS — F411 Generalized anxiety disorder: Secondary | ICD-10-CM | POA: Diagnosis not present

## 2019-03-29 MED FILL — ADDERALL XR 25 MG CAPSULE: 25 | 30 days supply | Qty: 30 | Fill #0

## 2019-04-07 ENCOUNTER — Ambulatory Visit (INDEPENDENT_AMBULATORY_CARE_PROVIDER_SITE_OTHER): Payer: No Typology Code available for payment source | Admitting: Psychology

## 2019-04-07 DIAGNOSIS — F411 Generalized anxiety disorder: Secondary | ICD-10-CM | POA: Diagnosis not present

## 2019-04-11 MED FILL — LEVOTHYROXINE 112 MCG TAB: 112 | 30 days supply | Qty: 30 | Fill #1

## 2019-04-11 MED FILL — ATENOLOL 50 MG TABLET: 50 | 90 days supply | Qty: 90 | Fill #2

## 2019-04-11 MED FILL — traZODone HCL 100 MG TABS: 100 | 30 days supply | Qty: 30 | Fill #1

## 2019-04-14 ENCOUNTER — Ambulatory Visit (INDEPENDENT_AMBULATORY_CARE_PROVIDER_SITE_OTHER): Payer: No Typology Code available for payment source | Admitting: Psychology

## 2019-04-14 DIAGNOSIS — F411 Generalized anxiety disorder: Secondary | ICD-10-CM | POA: Diagnosis not present

## 2019-04-24 ENCOUNTER — Ambulatory Visit: Payer: No Typology Code available for payment source | Admitting: Psychology

## 2019-04-24 MED FILL — buPROPion HCL ER (XL) 150 M: 150 | 30 days supply | Qty: 30 | Fill #1

## 2019-05-04 ENCOUNTER — Ambulatory Visit (INDEPENDENT_AMBULATORY_CARE_PROVIDER_SITE_OTHER): Payer: No Typology Code available for payment source | Admitting: Psychology

## 2019-05-04 DIAGNOSIS — F411 Generalized anxiety disorder: Secondary | ICD-10-CM | POA: Diagnosis not present

## 2019-05-05 MED FILL — ADDERALL XR 25 MG CAPSULE: 25 | 30 days supply | Qty: 30 | Fill #0

## 2019-05-11 ENCOUNTER — Ambulatory Visit: Payer: No Typology Code available for payment source | Admitting: Psychology

## 2019-05-15 MED FILL — LEVOTHYROXINE 112 MCG TAB: 112 | 30 days supply | Qty: 30 | Fill #2

## 2019-05-16 MED FILL — traZODone HCL 100 MG TABS: 100 | 90 days supply | Qty: 90 | Fill #0

## 2019-05-18 ENCOUNTER — Ambulatory Visit (INDEPENDENT_AMBULATORY_CARE_PROVIDER_SITE_OTHER): Payer: No Typology Code available for payment source | Admitting: Psychology

## 2019-05-18 DIAGNOSIS — F411 Generalized anxiety disorder: Secondary | ICD-10-CM | POA: Diagnosis not present

## 2019-05-25 ENCOUNTER — Ambulatory Visit (INDEPENDENT_AMBULATORY_CARE_PROVIDER_SITE_OTHER): Payer: No Typology Code available for payment source | Admitting: Psychology

## 2019-05-25 DIAGNOSIS — F411 Generalized anxiety disorder: Secondary | ICD-10-CM

## 2019-05-26 MED FILL — buPROPion HCL ER (XL) 150 M: 150 | 30 days supply | Qty: 30 | Fill #2

## 2019-06-01 ENCOUNTER — Ambulatory Visit: Payer: No Typology Code available for payment source | Admitting: Psychology

## 2019-06-09 MED FILL — ADDERALL XR 25 MG CAPSULE: 25 | 30 days supply | Qty: 30 | Fill #0

## 2019-06-15 ENCOUNTER — Ambulatory Visit (INDEPENDENT_AMBULATORY_CARE_PROVIDER_SITE_OTHER): Payer: No Typology Code available for payment source | Admitting: Psychology

## 2019-06-15 DIAGNOSIS — F411 Generalized anxiety disorder: Secondary | ICD-10-CM

## 2019-06-19 ENCOUNTER — Telehealth (HOSPITAL_COMMUNITY): Payer: Self-pay | Admitting: Family Medicine

## 2019-06-19 MED ORDER — ONDANSETRON HCL 4 MG PO TABS: 4.0000 mg | ORAL_TABLET | Freq: Four times a day (QID) | ORAL | 0 refills | Status: AC

## 2019-06-19 MED FILL — ONDANSETRON HCL 4 MG TABLET: 4 | 3 days supply | Qty: 12 | Fill #0

## 2019-06-19 MED FILL — LEVOTHYROXINE 112 MCG TAB: 112 | 30 days supply | Qty: 30 | Fill #3

## 2019-06-19 NOTE — Telephone Encounter (Signed)
C/o nausea Needs refill zofran

## 2019-06-21 ENCOUNTER — Ambulatory Visit (INDEPENDENT_AMBULATORY_CARE_PROVIDER_SITE_OTHER): Payer: No Typology Code available for payment source | Admitting: Psychology

## 2019-06-21 DIAGNOSIS — F411 Generalized anxiety disorder: Secondary | ICD-10-CM | POA: Diagnosis not present

## 2019-06-27 MED FILL — MONTELUKAST SOD 10 MG TAB: 10 | 30 days supply | Qty: 30 | Fill #0

## 2019-06-27 MED FILL — LORazepam 0.5 MG TABS: 0.5 | 15 days supply | Qty: 15 | Fill #0

## 2019-06-27 MED FILL — traZODone HCL 100 MG TABS: 100 | 30 days supply | Qty: 60 | Fill #0

## 2019-06-27 MED FILL — buPROPion HCL ER (XL) 150 M: 150 | 30 days supply | Qty: 30 | Fill #0

## 2019-06-28 ENCOUNTER — Ambulatory Visit (INDEPENDENT_AMBULATORY_CARE_PROVIDER_SITE_OTHER): Payer: No Typology Code available for payment source | Admitting: Psychology

## 2019-06-28 DIAGNOSIS — F411 Generalized anxiety disorder: Secondary | ICD-10-CM

## 2019-07-12 ENCOUNTER — Ambulatory Visit: Payer: No Typology Code available for payment source | Admitting: Psychology

## 2019-07-17 MED FILL — LEVOTHYROXINE 112 MCG TAB: 112 | 60 days supply | Qty: 60 | Fill #4

## 2019-07-17 MED FILL — ATENOLOL 50 MG TABLET: 50 | 30 days supply | Qty: 30 | Fill #0

## 2019-07-21 MED FILL — ADDERALL XR 25 MG CAPSULE: 25 | 30 days supply | Qty: 30 | Fill #0

## 2019-10-03 DIAGNOSIS — F900 Attention-deficit hyperactivity disorder, predominantly inattentive type: Secondary | ICD-10-CM | POA: Insufficient documentation

## 2019-10-04 DIAGNOSIS — I10 Essential (primary) hypertension: Secondary | ICD-10-CM | POA: Insufficient documentation

## 2019-10-04 DIAGNOSIS — R000 Tachycardia, unspecified: Secondary | ICD-10-CM | POA: Insufficient documentation

## 2019-10-04 DIAGNOSIS — F418 Other specified anxiety disorders: Secondary | ICD-10-CM | POA: Insufficient documentation

## 2019-10-04 DIAGNOSIS — F319 Bipolar disorder, unspecified: Secondary | ICD-10-CM | POA: Insufficient documentation

## 2019-11-21 LAB — COVID-19 CARE EVERYWHERE: COVID-19 CARE EVERYWHERE: NEGATIVE

## 2020-01-04 DIAGNOSIS — D72810 Lymphocytopenia: Secondary | ICD-10-CM | POA: Insufficient documentation

## 2020-03-28 DIAGNOSIS — R1031 Right lower quadrant pain: Secondary | ICD-10-CM | POA: Insufficient documentation

## 2020-04-09 ENCOUNTER — Ambulatory Visit (INDEPENDENT_AMBULATORY_CARE_PROVIDER_SITE_OTHER): Payer: 59 | Admitting: Psychology

## 2020-04-09 DIAGNOSIS — F411 Generalized anxiety disorder: Secondary | ICD-10-CM

## 2020-05-28 LAB — COVID-19 CARE EVERYWHERE: COVID-19 CARE EVERYWHERE: NOT DETECTED — NL

## 2020-05-28 LAB — LAB EXTERNAL RESULT UNMAPPED

## 2020-07-16 ENCOUNTER — Ambulatory Visit (HOSPITAL_BASED_OUTPATIENT_CLINIC_OR_DEPARTMENT_OTHER): Payer: Commercial Managed Care - PPO | Admitting: Family Medicine

## 2020-07-18 ENCOUNTER — Encounter (HOSPITAL_BASED_OUTPATIENT_CLINIC_OR_DEPARTMENT_OTHER): Payer: Self-pay | Admitting: Family Medicine

## 2020-07-18 ENCOUNTER — Ambulatory Visit: Payer: Commercial Managed Care - PPO | Attending: Family Medicine | Admitting: Family Medicine

## 2020-07-18 DIAGNOSIS — J302 Other seasonal allergic rhinitis: Secondary | ICD-10-CM | POA: Insufficient documentation

## 2020-07-18 DIAGNOSIS — K529 Noninfective gastroenteritis and colitis, unspecified: Secondary | ICD-10-CM | POA: Insufficient documentation

## 2020-07-18 DIAGNOSIS — M5413 Radiculopathy, cervicothoracic region: Secondary | ICD-10-CM | POA: Insufficient documentation

## 2020-07-18 DIAGNOSIS — K219 Gastro-esophageal reflux disease without esophagitis: Secondary | ICD-10-CM | POA: Insufficient documentation

## 2020-07-18 DIAGNOSIS — Z7689 Persons encountering health services in other specified circumstances: Secondary | ICD-10-CM | POA: Insufficient documentation

## 2020-07-18 DIAGNOSIS — F418 Other specified anxiety disorders: Secondary | ICD-10-CM | POA: Insufficient documentation

## 2020-07-18 DIAGNOSIS — E063 Autoimmune thyroiditis: Secondary | ICD-10-CM | POA: Diagnosis present

## 2020-07-18 DIAGNOSIS — I10 Essential (primary) hypertension: Secondary | ICD-10-CM | POA: Diagnosis not present

## 2020-07-18 DIAGNOSIS — F900 Attention-deficit hyperactivity disorder, predominantly inattentive type: Secondary | ICD-10-CM | POA: Diagnosis not present

## 2020-07-18 DIAGNOSIS — E038 Other specified hypothyroidism: Secondary | ICD-10-CM | POA: Insufficient documentation

## 2020-07-18 MED ORDER — ONDANSETRON 8 MG PO TBDP
8.0000 mg | ORAL_TABLET | Freq: Two times a day (BID) | ORAL | 1 refills | Status: AC | PRN
Start: 2020-07-18 — End: 2020-08-17

## 2020-07-18 NOTE — Progress Notes (Signed)
[facility]  Telehealth Encounter    Patient called per clinic protocol.  Interpreter was not used for this visit.     History    Establish care  - works as a travel Engineer, civil (consulting)  - based out of [place] but has also traveled to [facility] and now in [place] - current contract ends in July and may extend    Colitis  - seen on CT scan  - was scheduled for colonoscopy and GI appointment in [place] but concerned about traveling for the testing    Tachycardia,  HTN  - h/o HTN, preceded by tachycardia  - underwent extensive work-up including echo  - started atenolol which helped both with HR and HTN    ADHD   - diagnosed in childhood  - takes adderall XR, has been rationing since she ran out of refills as prior prescribers were not comfortable filling out of state  - using only on days when she works but is worsening depression     Objective:  Unable to obtain vital signs not for this telehealth visit.    Exam:  Gen: No acute distress  Respiratory: Speaking in full sentences, no dyspnea  Psych: Normal mood/affect    Assessment/Plan:    1. 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 Planning to work in [place] for the next 3-4 months, will possibly extend beyond that. Acute concerns related to medication refills and symptomatic GI disturbance - discussion precluded full review of history however some information is available in CareEverywhere. Would recommend follow-up CPEX to review additional history and confirm HM gaps below, non-urgently.  - Pap completed 01/2019 per outside records however no HPV documented -- needs clarification  - HIV and HCV testing done in 2018 but results not visible, presumed negative but required clarification     2. Attention deficit hyperactivity disorder (ADHD), predominantly inattentive type  Outside records note a history of symptoms since childhood, stable on adderall XR 25mg  x years, with appropriate use and significant benefit. Discussed  that due to limited time for discussion today and reported cardiac history (tachycardia, HTN on atenolol) would at a minimum request lab/vitals visit including drug screening prior to providing a refill. Will request urgent televisit follow-up to obtain more history, though likely represents appropriate diagnosis and treatment plan.   - AMPHETAMINES URINE; Future  - BENZODIAZEPINES URINE; Future  - COCAINE METABOLITES URINE; Future  - METHADONE URINE; Future  - OPIATES URINE; Future  - OXYCODONE SCREEN URINE; Future  - CANNABINOIDS URINE; Future  - FENTANYL URINE; Future    3. Essential hypertension  Improved on atenolol. Per Chiann, tachycardia predated HTN and both improved on atenolol. Underwent work-up including echo - would clarify details and consider further work-up/referral pending labs and vital sign measurement.    4. Hypothyroidism due to Hashimoto's thyroiditis  Takes levothyroxine , last TSH 5.8 in November 2021, will recheck - query whether thyroid played a role in HR/BP abnormalities previously.  - THYROID SCREEN TSH REFLEX FT4; Future    5. Colitis, nonspecific  History of "enteritis" seen on CT previously. No significant abnormalities on CBC or CMP previously; has a resurgence of symptoms currently and requesting anti-emetic which has helped her in the past. Had referral out of state of GI consultation and colonoscopy but will not be able to travel for these; discussed option to have appointments done here in [place] but would avoid consultation with multiple providers  if possible to prevent conflicting treatment plans. Will obtain initial testing as below prior to specialty appointment (number provided for patient to schedule).   - REFERRAL TO GASTROENTEROLOGY ( INT)  - COMPREHENSIVE METABOLIC PANEL; Future  - CBC WITH PLATELET; Future  - FAT AND FIBER STOOL; Future  - FECAL LEUKOCYTE SCREEN; Future  - CALPROTECTIN FECAL; Future  - RBC SEDIMENTATION RATE; Future  - C-REACTIVE PROTEIN; Future  -  CELIAC PANEL; Future  - ENTERIC BACTERIAL PANEL PCR; Future  - ondansetron (ZOFRAN-ODT) 8 MG disintegrating tablet; Take 1 tablet by mouth every 12 (twelve) hours as needed for Nausea  Dispense: 15 tablet; Refill: 1    6. Mixed anxiety and depression  Mood has worsened with decreased access to Adderall. Typically takes bupropion - has adequate refills. Recommend more detailed discussion at future visit, and connection to psychotherapy/mindfulness resources as interested.      Follow-up: 2 week(s) or sooner for televisit, following lab/vitals only visit, to discuss mood and ADHD    I spent a total of 45 minutes on this visit on the date of service (total time includes all activities performed on the date of service)      [name], MD

## 2020-07-22 ENCOUNTER — Ambulatory Visit: Payer: Commercial Managed Care - PPO | Attending: Family Medicine

## 2020-07-22 DIAGNOSIS — E038 Other specified hypothyroidism: Secondary | ICD-10-CM

## 2020-07-22 DIAGNOSIS — K529 Noninfective gastroenteritis and colitis, unspecified: Secondary | ICD-10-CM | POA: Insufficient documentation

## 2020-07-22 DIAGNOSIS — E063 Autoimmune thyroiditis: Secondary | ICD-10-CM | POA: Diagnosis present

## 2020-07-22 DIAGNOSIS — F900 Attention-deficit hyperactivity disorder, predominantly inattentive type: Secondary | ICD-10-CM | POA: Insufficient documentation

## 2020-07-22 LAB — AMPHETAMINES URINE: AMPHETAMINES URINE: POSITIVE — AB

## 2020-07-22 LAB — CBC WITH PLATELET
ABSOLUTE NRBC COUNT: 0 10*3/uL (ref 0.0–0.0)
HEMATOCRIT: 40.5 % (ref 34.1–44.9)
HEMOGLOBIN: 13.3 g/dL (ref 11.2–15.7)
MEAN CORPUSCULAR HGB: 28.7 pg (ref 26.0–34.0)
MEAN PLATELET VOLUME: 11.7 fL (ref 8.7–12.5)
NRBC %: 0 % (ref 0.0–0.0)
PLATELET COUNT: 255 10*3/uL (ref 150–400)
RBC DISTRIBUTION WIDTH STD DEV: 39 fL (ref 35.1–46.3)
WHITE BLOOD CELL COUNT: 2.8 10*3/uL — ABNORMAL LOW (ref 4.0–11.0)

## 2020-07-22 LAB — OXYCODONE SCREEN URINE
OXYCOD SCRN URINE: NEGATIVE
OXYCOD UR SPEC GRAV: 1.023 (ref 1.003–1.035)
OXYCOD URINE CREAT: >300 md/dL
OXYCOD URINE PH: 5.5 (ref 4.5–8.5)

## 2020-07-22 LAB — OPIATES URINE: OPIATES URINE: NEGATIVE

## 2020-07-22 LAB — THYROID SCREEN TSH REFLEX FT4: THYROID SCREEN TSH REFLEX FT4: 1.72 u[IU]/mL (ref 0.358–3.740)

## 2020-07-22 LAB — COMPREHENSIVE METABOLIC PANEL
ALANINE AMINOTRANSFERASE: 24 U/L (ref 12–45)
ALBUMIN: 3.9 g/dL (ref 3.4–5.0)
ALKALINE PHOSPHATASE: 98 U/L (ref 45–117)
ANION GAP: 6 mmol/L (ref 5–15)
ASPARTATE AMINOTRANSFERASE: 19 U/L (ref 8–34)
BILIRUBIN TOTAL: 0.9 mg/dL (ref 0.2–1.0)
BUN (UREA NITROGEN): 16 mg/dL (ref 7–18)
CALCIUM: 9 mg/dL (ref 8.5–10.1)
CARBON DIOXIDE: 26 mmol/L (ref 21–32)
CHLORIDE: 106 mmol/L (ref 98–107)
CREATININE: 1 mg/dL (ref 0.4–1.2)
ESTIMATED GLOMERULAR FILT RATE: >60 mL/min (ref >60)
Glucose Random: 89 mg/dL (ref 74–160)
POTASSIUM: 4.3 mmol/L (ref 3.5–5.1)
SODIUM: 138 mmol/L (ref 136–145)
TOTAL PROTEIN: 7.2 g/dL (ref 6.4–8.2)

## 2020-07-22 LAB — C-REACTIVE PROTEIN: C-REACTIVE PROTEIN: <0.3 mg/dL (ref 0.0–1.8)

## 2020-07-22 LAB — BENZODIAZEPINES URINE: BENZODIAZEPINES URINE: NEGATIVE

## 2020-07-22 LAB — CANNABINOIDS URINE: CANNABINOIDS URINE: POSITIVE — AB

## 2020-07-22 LAB — METHADONE URINE: METHADONE URINE: NEGATIVE

## 2020-07-22 LAB — COCAINE METABOLITES URINE: COCAINE METABOLITES URINE: NEGATIVE

## 2020-07-22 LAB — FENTANYL URINE: FENTANYL URINE: NEGATIVE

## 2020-07-22 NOTE — Progress Notes (Signed)
Labs drawn, urine collected, and vital done.    [place], [place], 07/22/2020

## 2020-07-23 ENCOUNTER — Encounter (HOSPITAL_BASED_OUTPATIENT_CLINIC_OR_DEPARTMENT_OTHER): Payer: Self-pay | Admitting: Family Medicine

## 2020-07-23 ENCOUNTER — Other Ambulatory Visit (HOSPITAL_BASED_OUTPATIENT_CLINIC_OR_DEPARTMENT_OTHER): Payer: Self-pay | Admitting: Family Medicine

## 2020-07-23 DIAGNOSIS — F900 Attention-deficit hyperactivity disorder, predominantly inattentive type: Secondary | ICD-10-CM

## 2020-07-23 DIAGNOSIS — D72810 Lymphocytopenia: Secondary | ICD-10-CM

## 2020-07-23 LAB — CBC, PLATELET & DIFFERENTIAL
ABSOLUTE BASOPHIL COUNT MANUAL: 0.1 10*3/uL (ref 0.0–0.1)
ABSOLUTE EO COUNT MANUAL: 0.1 10*3/uL (ref 0.0–0.8)
ABSOLUTE LYMPH COUNT MANUAL: 1 10*3/uL (ref 0.6–5.9)
ABSOLUTE MONOCYTE COUNT MANUAL: 0.2 10*3/uL (ref 0.2–1.4)
ABSOLUTE NEUT COUNT MANUAL: 1.4 THuL — ABNORMAL LOW (ref 1.6–8.3)
ABSOLUTE NRBC COUNT: 0 10*3/uL (ref 0.0–0.0)
ATYPICAL LYMPHOCYTES %: 4 % (ref 0.0–12.0)
BAND NEUTROPHILS %: 17 % — ABNORMAL HIGH (ref 0.0–8.0)
BASOPHILS %: 4 % — ABNORMAL HIGH (ref 0.0–1.2)
EOSINOPHILS %: 4 % (ref 0.0–7.0)
HEMATOCRIT: 40.5 % (ref 34.1–44.9)
HEMOGLOBIN: 13.3 g/dL (ref 11.2–15.7)
LYMPHOCYTES %: 33 % (ref 15.0–54.0)
MEAN CORP HGB CONC: 32.8 g/dL (ref 31.0–37.0)
MEAN CORPUSCULAR HGB: 28.7 pg (ref 26.0–34.0)
MEAN CORPUSCULAR VOL: 87.5 fl (ref 80.0–100.0)
MEAN PLATELET VOLUME: 11.7 fL (ref 8.7–12.5)
MONOCYTES %: 6 % (ref 4.0–13.0)
NRBC %: 0 % (ref 0.0–0.0)
NUCLEATED RED BLOOD CELLS: 0 /100 WC (ref 0.0–0.0)
PLATELET COUNT: 255 10*3/uL (ref 150–400)
PLATELET ESTIMATE: NORMAL
POLYMORPHONUCLEAR (SEGS) %: 32 % — ABNORMAL LOW (ref 40.0–75.0)
RBC DISTRIBUTION WIDTH STD DEV: 39 fL (ref 35.1–46.3)
RED BLOOD CELL COUNT: 4.63 M/uL (ref 3.90–5.20)
WHITE BLOOD CELL COUNT: 2.8 10*3/uL — ABNORMAL LOW (ref 4.0–11.0)

## 2020-07-23 LAB — CELIAC PANEL: TISSUE TRANSGLUTAMINASE IgA: <0.5 U/mL (ref 0–14.9)

## 2020-07-23 LAB — RBC SEDIMENTATION RATE: RBC SEDIMENTATION RATE: 4 MM/HR (ref 0–20)

## 2020-07-23 MED ORDER — AMPHETAMINE-DEXTROAMPHET ER 25 MG PO CP24
ORAL_CAPSULE | ORAL | 0 refills | Status: DC
Start: 2020-07-23 — End: 2020-08-26

## 2020-07-26 ENCOUNTER — Ambulatory Visit: Payer: Commercial Managed Care - PPO | Attending: Gastroenterology | Admitting: Gastroenterology

## 2020-07-26 DIAGNOSIS — R1031 Right lower quadrant pain: Secondary | ICD-10-CM | POA: Diagnosis present

## 2020-07-26 MED ORDER — PEG 3350-KCL-NA BICARB-NACL 420 G PO SOLR
4000.00 mL | ORAL | 0 refills | Status: AC
Start: 2020-07-26 — End: 2020-10-24

## 2020-07-26 NOTE — Progress Notes (Signed)
Date of Service: 07/26/2020      I was asked by Dr. 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     Ten years ago, she had cramping abdominal pain with some right upper quadrant   component.  She recalls being told of a normal ultrasound.  She said she was   seen by a gastroenterologist who told her that she had irritable bowel   syndrome.     Over the subsequent years, she has continued to have right sided pain,   sometimes with diarrhea, sometimes constipation.  She has also had nausea.    The pain is mainly right upper quadrant, but may radiate to the right lower   quadrant.  It is not particularly brought about by food.  The pain may be   sharp or cramping.  She has had a poor appetite with nausea.  She feels that   she may have lost some weight.  There have been no fevers or chills.  There is   no family history of inflammatory bowel disease, polyps, or colon cancer.     She averages a bowel movement every 3 to 4 days with occasional blood that she   has ascribed to hemorrhoids.     She was seen in [place] for this and had a CAT scan and was told of   colitis.  She was scheduled for an endoscopy and a colonoscopy, but because of   her work she has not been able to get it done in [place].     She recalls having had normal stool tests, a CRP and a normal TTG.     Her white count has always been somewhat low at 2800.     The main differential is between irritable bowel syndrome and inflammatory   bowel disease.  I am not sure what to make of the CAT scan findings, but it   does seem reasonable to do a combined endoscopy and colonoscopy.     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  She is going to try to extend her contract to be able   to get the  endoscopic studies done here.     Followup will be with her primary care physician and the doctor who does the   procedures.                                                             Reviewed and Electronically Signed By: Florene Glen, MD  Sig Date: 07/26/2020  Sig Time: 12:37:50  Dictated By: Florene Glen, MD  Dict Date: 07/26/2020 Dict Time: 11:24 AM      Dictation Date and Time:  07/26/2020 11:24:08  Transcription Date and Time:  07/26/2020 11:53:00   eScription Dictation id: [id number]       cc:    [name]

## 2020-07-26 NOTE — Progress Notes (Signed)
I spent 35 minutes in total on today's visit on thisdate

## 2020-07-29 ENCOUNTER — Ambulatory Visit: Payer: Commercial Managed Care - PPO | Attending: Family Medicine

## 2020-07-29 DIAGNOSIS — K529 Noninfective gastroenteritis and colitis, unspecified: Secondary | ICD-10-CM | POA: Insufficient documentation

## 2020-07-29 NOTE — Addendum Note (Signed)
Addended byMartha Clan on: 07/29/2020 01:44 PM     Modules accepted: Orders

## 2020-07-29 NOTE — Progress Notes (Signed)
Stool drop off

## 2020-07-30 LAB — FECAL LEUKOCYTE SCREEN: FECAL LEUKOCYTE SCREEN: NONE SEEN

## 2020-07-31 LAB — FAT AND FIBER STOOL

## 2020-08-02 LAB — CALPROTECTIN FECAL: CALPROTECTIN FECAL: 50 ug/g (ref 0–120)

## 2020-08-03 NOTE — Progress Notes (Signed)
[facility]  Telehealth Encounter    Patient called per clinic protocol.  Interpreter was not used for this visit.     History    ADHD follow-up  - went through specialized testing in [place], 3-4 years ago  - health needs neglected in childhood so some symptoms missed  - tried a few other stimulants previously -- Concerta, another one -- had emotional side effects  - Adderall had worked well   - when not taking medicine struggles with low motivation, feeling sluggish, can't complete tasks    Mood  - bupropion -- started prior to ADHD so can't tell if it is doing anything (e.g. how she would feel without bupropion and on stimulant only)  - has poor sleep -- goes to bed late  - feels less sulky and depressed after restarting adderall  - tried Lexapro in the past - decreased sex drive, stopped after 1w, unsure if any mood benefit    GI symptoms  - feeling better  - taking zofran which helps  - trying to eat better foods  - still has cramps in upper/right side of abdomen  - has EGD/colonscopy scheduled ADHD    Objective:  Unable to obtain vital signs not for this telehealth visit.    Exam:  Gen: No acute distress  Respiratory: Speaking in full sentences, no dyspnea  Psych: Normal mood/affect    Assessment/Plan:    Problem List     Mixed anxiety and depressive disorder    Overview     Features of depression: low energy/motivation, poor sleep  Co-morbid ADHD  Previously tried escitalopram with sexual side effects  Tried bupropion x years with unclear benefit         Current Assessment & Plan     Discussed option to change antidepressant medication given ongoing mild symptoms. Gwendolyn Stark elects to taper bupropion and start fluoxetine (would avoid combining activating SSRI, bupropion, and stimulant given some anxiety and sleep impairment). Plan to start 10mg  fluoxetine + bupropion 75mg  x1 week, then fluoxetine 10mg  only with option to uptitrate.   As an alternative could consider retrying less activating SSRI  with bupropion on board for sexual side effects.          Relevant Medications    FLUoxetine (PROZAC) 10 MG capsule    Attention deficit hyperactivity disorder (ADHD), predominantly inattentive type    Overview     Stable ADHD diagnosed adulthood with psychiatry retrospective symptoms to early childhood managed most recently by her PCP in [place]. Symptoms well controlled on her current regimen adderall XR 25mg  PO daily   Prior trials of other stimulants including Concerta - emotional side effects.   No concerns for misuse or diversion  [x]  UDS 06/2020 as expected         Current Assessment & Plan     Reviewed history today. Derives benefit in function from stimulant. Appropriate for continued refills - next due 4/28.   Is interested in considering IR formulation due to concern about absorption (takes time to notice effect of med) but discussed risk of "crashes" in energy so would try to avoid this.   Will continue to work on mood management (see separate problem).         IBS (irritable bowel syndrome)    Overview     On going intermittent crampy abdominal pain with irregular BMs sometimes with mucus.  Previously normal work-up including abdominal U/S, 1 x CT scan with enteritis.   Normal inflammatory markers  and stool studies  [ ]  awaiting EGD/colonoscopy with GI in July 2022         Current Assessment & Plan     Improved symptoms with ondansetron. Reviewed reassuring lab results today. Continue dietary modifications as needed.               Follow-up: 4 week(s) for mood follow-up, in person for monitoring of vital signs    I spent a total of 30 minutes on this visit on the date of service (total time includes all activities performed on the date of service)      August 2022, MD

## 2020-08-05 ENCOUNTER — Encounter (HOSPITAL_BASED_OUTPATIENT_CLINIC_OR_DEPARTMENT_OTHER): Payer: Self-pay | Admitting: Family Medicine

## 2020-08-05 ENCOUNTER — Encounter (HOSPITAL_BASED_OUTPATIENT_CLINIC_OR_DEPARTMENT_OTHER): Payer: Self-pay | Admitting: General Practice

## 2020-08-05 ENCOUNTER — Other Ambulatory Visit (HOSPITAL_BASED_OUTPATIENT_CLINIC_OR_DEPARTMENT_OTHER): Payer: Self-pay | Admitting: Family Medicine

## 2020-08-05 ENCOUNTER — Ambulatory Visit: Payer: Commercial Managed Care - PPO | Attending: Family Medicine | Admitting: Family Medicine

## 2020-08-05 DIAGNOSIS — F900 Attention-deficit hyperactivity disorder, predominantly inattentive type: Secondary | ICD-10-CM | POA: Diagnosis not present

## 2020-08-05 DIAGNOSIS — F418 Other specified anxiety disorders: Secondary | ICD-10-CM | POA: Insufficient documentation

## 2020-08-05 DIAGNOSIS — K589 Irritable bowel syndrome without diarrhea: Secondary | ICD-10-CM | POA: Diagnosis not present

## 2020-08-05 MED ORDER — FLUOXETINE HCL 10 MG PO CAPS
10.0000 mg | ORAL_CAPSULE | Freq: Every day | ORAL | 1 refills | Status: DC
Start: 2020-08-05 — End: 2020-11-11

## 2020-08-05 NOTE — Telephone Encounter (Signed)
Patient scheduled for EGD/COLON (Standard) 7/27 arrival time 9:30 AM [facility]. Written prep materials sent to patient via [facility] and mail. Nursing will follow up with teaching over the phone in early July.

## 2020-08-05 NOTE — Telephone Encounter (Signed)
I called the patient and Left a voicemail to return call    If patient calls back, please: Book the appointment per the following guidance: see below     [name], MD  [facility] Televisit Followup Pool  Hi team,   Please assist this patient with the following:     Schedule in-person visit for mood follow-up, time frame: 4 weeks, with PCP/blue team, if appointment not available: Schedule further out with provider indicated     Thanks!   Synetta Fail

## 2020-08-05 NOTE — Assessment & Plan Note (Signed)
Discussed option to change antidepressant medication given ongoing mild symptoms. Gwendolyn Stark elects to taper bupropion and start fluoxetine (would avoid combining activating SSRI, bupropion, and stimulant given some anxiety and sleep impairment). Plan to start 10mg  fluoxetine + bupropion 75mg  x1 week, then fluoxetine 10mg  only with option to uptitrate.   As an alternative could consider retrying less activating SSRI with bupropion on board for sexual side effects.

## 2020-08-05 NOTE — Assessment & Plan Note (Signed)
Reviewed history today. Derives benefit in function from stimulant. Appropriate for continued refills - next due 4/28.   Is interested in considering IR formulation due to concern about absorption (takes time to notice effect of med) but discussed risk of "crashes" in energy so would try to avoid this.   Will continue to work on mood management (see separate problem).

## 2020-08-05 NOTE — Assessment & Plan Note (Signed)
Improved symptoms with ondansetron. Reviewed reassuring lab results today. Continue dietary modifications as needed.

## 2020-08-26 ENCOUNTER — Encounter (HOSPITAL_BASED_OUTPATIENT_CLINIC_OR_DEPARTMENT_OTHER): Payer: Self-pay | Admitting: Family Medicine

## 2020-08-26 DIAGNOSIS — F900 Attention-deficit hyperactivity disorder, predominantly inattentive type: Secondary | ICD-10-CM

## 2020-08-26 MED ORDER — AMPHETAMINE-DEXTROAMPHET ER 25 MG PO CP24
ORAL_CAPSULE | ORAL | 0 refills | Status: DC
Start: 2020-08-26 — End: 2020-09-26

## 2020-08-26 NOTE — Telephone Encounter (Signed)
PER Patient (self), Gwendolyn Stark is a 35 year old female has requested a refill of Adderall XR 25 mg 24 hr      Last prescribed - start date: 07/23/20 end date: 08/22/20     Last Office Visit: 08/05/20  Last Physical Exam: n/a  HPV SCREENING Never done     ADHD Med: Most Recent BP Reading(s)  07/22/20 : 116/72   Most Recent Weight Reading(s)  07/22/20 : 72.6 kg (160 lb)       Documented patient preferred pharmacies:    [id number] - [place], [place] - [address]  Phone: [phone] Fax: [phone]

## 2020-09-08 ENCOUNTER — Other Ambulatory Visit (HOSPITAL_BASED_OUTPATIENT_CLINIC_OR_DEPARTMENT_OTHER): Payer: Self-pay | Admitting: Family Medicine

## 2020-09-08 DIAGNOSIS — F418 Other specified anxiety disorders: Secondary | ICD-10-CM

## 2020-09-09 LAB — LAB EXTERNAL RESULT UNMAPPED

## 2020-09-10 LAB — COVID-19 CARE EVERYWHERE: COVID-19 CARE EVERYWHERE: NOT DETECTED

## 2020-09-13 ENCOUNTER — Encounter (HOSPITAL_BASED_OUTPATIENT_CLINIC_OR_DEPARTMENT_OTHER): Payer: Self-pay | Admitting: Family Medicine

## 2020-09-26 ENCOUNTER — Encounter (HOSPITAL_BASED_OUTPATIENT_CLINIC_OR_DEPARTMENT_OTHER): Payer: Self-pay | Admitting: Family Medicine

## 2020-09-26 DIAGNOSIS — F900 Attention-deficit hyperactivity disorder, predominantly inattentive type: Secondary | ICD-10-CM

## 2020-09-26 MED ORDER — AMPHETAMINE-DEXTROAMPHET ER 25 MG PO CP24
ORAL_CAPSULE | ORAL | 0 refills | Status: DC
Start: 2020-09-26 — End: 2020-11-01

## 2020-09-26 NOTE — Telephone Encounter (Signed)
PER Patient (self), Gwendolyn Stark is a 35 year old female has requested a refill of adderall xr.      Last Office Visit: 08/05/20 with [name]    Last Physical Exam: n/a      ADHD Med: Most Recent BP Reading(s)  07/22/20 : 116/72   Most Recent Weight Reading(s)  07/22/20 : 72.6 kg (160 lb)      Documented patient preferred pharmacies:    [id number] - [place], [place] - [address]  Phone: [phone] Fax: [phone]

## 2020-10-01 ENCOUNTER — Telehealth: Payer: 59 | Admitting: Nurse Practitioner

## 2020-10-01 DIAGNOSIS — L03317 Cellulitis of buttock: Secondary | ICD-10-CM | POA: Diagnosis not present

## 2020-10-01 MED ORDER — SULFAMETHOXAZOLE-TRIMETHOPRIM 800-160 MG PO TABS: 1.0000 | ORAL_TABLET | Freq: Two times a day (BID) | ORAL | 0 refills | Status: AC

## 2020-10-01 NOTE — Progress Notes (Signed)
E Visit for Cellulitis  We are sorry that you are not feeling well. Here is how we plan to help!  Based on what you shared with me it looks like you have cellulitis.  Cellulitis looks like areas of skin redness, swelling, and warmth; it develops as a result of bacteria entering under the skin. Little red spots and/or bleeding can be seen in skin, and tiny surface sacs containing fluid can occur. Fever can be present. Cellulitis is almost always on one side of a body, and the lower limbs are the most common site of involvement.   I have prescribed:  Bactrim DS 1 tablet by mouth twice a day for 7 days  HOME CARE:  . Take your medications as ordered and take all of them, even if the skin irritation appears to be healing.   GET HELP RIGHT AWAY IF:  . Symptoms that don't begin to go away within 48 hours. . Severe redness persists or worsens . If the area turns color, spreads or swells. . If it blisters and opens, develops yellow-brown crust or bleeds. . You develop a fever or chills. . If the pain increases or becomes unbearable.  . Are unable to keep fluids and food down.  MAKE SURE YOU    Understand these instructions.  Will watch your condition.  Will get help right away if you are not doing well or get worse.  Thank you for choosing an e-visit. Your e-visit answers were reviewed by a board certified advanced clinical practitioner to complete your personal care plan. Depending upon the condition, your plan could have included both over the counter or prescription medications. Please review your pharmacy choice. Make sure the pharmacy is open so you can pick up prescription now. If there is a problem, you may contact your provider through [employer] and have the prescription routed to another pharmacy. Your safety is important to [place]. If you have drug allergies check your prescription carefully.  For the next 24 hours you can use MyChart to ask questions about today's visit,  request a non-urgent call back, or ask for a work or school excuse. You will get an email in the next two days asking about your experience. I hope that your e-visit has been valuable and will speed your recovery.  I spent approximately 7 minutes reviewing the patient's history symptoms images and coordinating her care   Meds ordered this encounter  Medications  . sulfamethoxazole-trimethoprim (BACTRIM DS) 800-160 MG tablet    Sig: Take 1 tablet by mouth 2 (two) times daily for 7 days.    Dispense:  14 tablet    Refill:  0

## 2020-10-24 ENCOUNTER — Ambulatory Visit (INDEPENDENT_AMBULATORY_CARE_PROVIDER_SITE_OTHER): Payer: 59 | Admitting: Psychology

## 2020-10-24 DIAGNOSIS — F411 Generalized anxiety disorder: Secondary | ICD-10-CM | POA: Diagnosis not present

## 2020-11-01 ENCOUNTER — Encounter (HOSPITAL_BASED_OUTPATIENT_CLINIC_OR_DEPARTMENT_OTHER): Payer: Self-pay | Admitting: Family Medicine

## 2020-11-01 DIAGNOSIS — F900 Attention-deficit hyperactivity disorder, predominantly inattentive type: Secondary | ICD-10-CM

## 2020-11-01 MED ORDER — AMPHETAMINE-DEXTROAMPHET ER 25 MG PO CP24
ORAL_CAPSULE | ORAL | 0 refills | Status: DC
Start: 2020-11-01 — End: 2020-12-09

## 2020-11-01 NOTE — Telephone Encounter (Signed)
PER Patient (self), Gwendolyn Stark is a 35 year old female has requested a refill of Adderall XR 25 mg 24hr      Last prescribed - start date: 09/26/20 end date: 10/26/20     Last Office Visit: 08/05/20  Last Physical Exam: n/a      HPV SCREENING Never done     Other Med Adult:  Most Recent BP Reading(s)  07/22/20 : 116/72        No results found for: CHOLESTEROL  No results found for: LDL  No results found for: HDL  No results found for: TG      THYROID SCREEN TSH REFLEX FT4 (uIU/mL)   Date Value   07/22/2020 1.720         No results found for: TSH    No results found for: HGBA1C    No results found for: POCA1C      No results found for: INR    SODIUM (mmol/L)   Date Value   07/22/2020 138       POTASSIUM (mmol/L)   Date Value   07/22/2020 4.3           CREATININE (mg/dL)   Date Value   41/59/3012 1.0        Documented patient preferred pharmacies:    [facility] [place], [place] - [address]  Phone: [phone] Fax: [phone]

## 2020-11-06 ENCOUNTER — Other Ambulatory Visit (HOSPITAL_BASED_OUTPATIENT_CLINIC_OR_DEPARTMENT_OTHER): Payer: Self-pay | Admitting: Medical Specialties

## 2020-11-06 ENCOUNTER — Other Ambulatory Visit (HOSPITAL_BASED_OUTPATIENT_CLINIC_OR_DEPARTMENT_OTHER): Payer: Self-pay

## 2020-11-06 ENCOUNTER — Encounter (HOSPITAL_BASED_OUTPATIENT_CLINIC_OR_DEPARTMENT_OTHER): Payer: Self-pay

## 2020-11-06 DIAGNOSIS — Z01812 Encounter for preprocedural laboratory examination: Secondary | ICD-10-CM

## 2020-11-06 MED ORDER — SIMETHICONE 80 MG PO TABS
4.00 | ORAL_TABLET | ORAL | 0 refills | Status: AC
Start: 2020-11-06 — End: 2021-11-06

## 2020-11-06 MED ORDER — PEG 3350-KCL-NABCB-NACL-NASULF 236 G PO SOLR
4.00 L | ORAL | 0 refills | Status: AC
Start: 2020-11-06 — End: 2021-11-06

## 2020-11-06 NOTE — Progress Notes (Signed)
Left message in patient's voicemail. Confirmed patient's procedure for 11/20/20. Arrival time at 0915am. Location [address], [place], [place]. Ride home requirement and colonoscopy preparation instructions reviewed. Asked the patient to call [phone] with any questions about their procedure.Testing requirement reviewed. Covid test scheduled for 11/18/20 at 1035am in [place].

## 2020-11-09 ENCOUNTER — Other Ambulatory Visit (HOSPITAL_BASED_OUTPATIENT_CLINIC_OR_DEPARTMENT_OTHER): Payer: Self-pay | Admitting: Family Medicine

## 2020-11-09 DIAGNOSIS — F418 Other specified anxiety disorders: Secondary | ICD-10-CM

## 2020-11-09 NOTE — Telephone Encounter (Signed)
PER Pharmacy, Gwendolyn Stark is a 35 year old female has requested a refill of      -  fluoxetine        Last Office Visit: 08/05/20 with mathews   Last Physical Exam: na     HPV SCREENING Never done     Other Med Adult:  Most Recent BP Reading(s)  07/22/20 : 116/72        No results found for: CHOLESTEROL  No results found for: LDL  No results found for: HDL  No results found for: TG      THYROID SCREEN TSH REFLEX FT4 (uIU/mL)   Date Value   07/22/2020 1.720         No results found for: TSH    No results found for: HGBA1C    No results found for: POCA1C      No results found for: INR    SODIUM (mmol/L)   Date Value   07/22/2020 138       POTASSIUM (mmol/L)   Date Value   07/22/2020 4.3           CREATININE (mg/dL)   Date Value   16/01/9603 1.0        Documented patient preferred pharmacies:    [facility] [place], [place] - [address]  Phone: [phone] Fax: [phone]

## 2020-11-15 ENCOUNTER — Encounter (HOSPITAL_BASED_OUTPATIENT_CLINIC_OR_DEPARTMENT_OTHER): Payer: Self-pay | Admitting: Family Medicine

## 2020-11-15 NOTE — Telephone Encounter (Signed)
PER Patient (self), Gwendolyn Stark is a 35 year old female has requested a refill of levothyroxine.      Last Office Visit: 07/18/2020 with mathews  Last Physical Exam: n/a    HPV SCREENING Never done    Other Med Adult:  Most Recent BP Reading(s)  07/22/20 : 116/72        No results found for: CHOLESTEROL  No results found for: LDL  No results found for: HDL  No results found for: TG      THYROID SCREEN TSH REFLEX FT4 (uIU/mL)   Date Value   07/22/2020 1.720         No results found for: TSH    No results found for: HGBA1C    No results found for: POCA1C      No results found for: INR    SODIUM (mmol/L)   Date Value   07/22/2020 138       POTASSIUM (mmol/L)   Date Value   07/22/2020 4.3           CREATININE (mg/dL)   Date Value   25/75/0518 1.0       Documented patient preferred pharmacies:    [facility] [place], [place] - [address]  Phone: [phone] Fax: [phone]

## 2020-11-17 ENCOUNTER — Other Ambulatory Visit (HOSPITAL_BASED_OUTPATIENT_CLINIC_OR_DEPARTMENT_OTHER): Payer: Self-pay | Admitting: Family Medicine

## 2020-11-17 DIAGNOSIS — E038 Other specified hypothyroidism: Secondary | ICD-10-CM

## 2020-11-17 DIAGNOSIS — E063 Autoimmune thyroiditis: Secondary | ICD-10-CM

## 2020-11-17 MED ORDER — LEVOTHYROXINE SODIUM 112 MCG PO TABS
ORAL_TABLET | ORAL | 1 refills | Status: DC
Start: 2020-11-17 — End: 2020-11-17

## 2020-11-17 MED ORDER — LEVOTHYROXINE SODIUM 112 MCG PO TABS
ORAL_TABLET | ORAL | 1 refills | Status: DC
Start: 2020-11-17 — End: 2021-07-02

## 2020-11-18 ENCOUNTER — Ambulatory Visit
Admission: RE | Admit: 2020-11-18 | Discharge: 2020-11-18 | Disposition: A | Discharge status: Home / Self Care | Payer: Commercial Managed Care - PPO | Attending: Internal Medicine | Admitting: Internal Medicine

## 2020-11-18 DIAGNOSIS — Z20822 Contact with and (suspected) exposure to covid-19: Secondary | ICD-10-CM | POA: Diagnosis present

## 2020-11-19 ENCOUNTER — Encounter (HOSPITAL_BASED_OUTPATIENT_CLINIC_OR_DEPARTMENT_OTHER): Payer: Self-pay

## 2020-11-19 LAB — COVID-19 OUTPATIENT: COVID-19 OUTPATIENT: NEGATIVE

## 2020-11-19 NOTE — Progress Notes (Addendum)
Confirmed patient's procedure for 11/20/20. Arrival time at 0915am. Location [address], [place], [place]. Ride home requirement and colonoscopy preparation instructions reviewed. Asked the patient to call [phone] with any questions about their procedure.Covid test sample collected, 11/18/20 = negative.

## 2020-11-20 ENCOUNTER — Ambulatory Visit
Admission: RE | Admit: 2020-11-20 | Discharge: 2020-11-20 | Disposition: A | Discharge status: Home / Self Care | Payer: Commercial Managed Care - PPO | Attending: Family Medicine | Admitting: Family Medicine

## 2020-11-20 ENCOUNTER — Encounter (HOSPITAL_BASED_OUTPATIENT_CLINIC_OR_DEPARTMENT_OTHER): Payer: Self-pay | Admitting: Anesthesiology

## 2020-11-20 ENCOUNTER — Encounter (HOSPITAL_BASED_OUTPATIENT_CLINIC_OR_DEPARTMENT_OTHER): Payer: Self-pay

## 2020-11-20 ENCOUNTER — Ambulatory Visit (HOSPITAL_BASED_OUTPATIENT_CLINIC_OR_DEPARTMENT_OTHER): Payer: Self-pay | Admitting: Anesthesiology

## 2020-11-20 DIAGNOSIS — R933 Abnormal findings on diagnostic imaging of other parts of digestive tract: Secondary | ICD-10-CM | POA: Diagnosis not present

## 2020-11-20 DIAGNOSIS — K648 Other hemorrhoids: Secondary | ICD-10-CM | POA: Diagnosis present

## 2020-11-20 DIAGNOSIS — K219 Gastro-esophageal reflux disease without esophagitis: Secondary | ICD-10-CM | POA: Diagnosis not present

## 2020-11-20 DIAGNOSIS — R1013 Epigastric pain: Secondary | ICD-10-CM | POA: Diagnosis not present

## 2020-11-20 DIAGNOSIS — F909 Attention-deficit hyperactivity disorder, unspecified type: Secondary | ICD-10-CM | POA: Diagnosis not present

## 2020-11-20 DIAGNOSIS — I10 Essential (primary) hypertension: Secondary | ICD-10-CM | POA: Diagnosis not present

## 2020-11-20 DIAGNOSIS — K2950 Unspecified chronic gastritis without bleeding: Secondary | ICD-10-CM | POA: Diagnosis not present

## 2020-11-20 DIAGNOSIS — R1031 Right lower quadrant pain: Secondary | ICD-10-CM

## 2020-11-20 LAB — URINE PREGNANCY TEST (POINT OF CARE): HCG QUALITATIVE URINE: NEGATIVE

## 2020-11-20 MED ORDER — PROPOFOL INFUSION
INTRAVENOUS | Status: AC
Start: 2020-11-20 — End: 2020-11-20
  Filled 2020-11-20: qty 50

## 2020-11-20 MED ORDER — PROPOFOL 500 MG/50 ML IV
Freq: Once | INTRAVENOUS | Status: DC | PRN
Start: 2020-11-20 — End: 2020-11-20
  Administered 2020-11-20: 220 ug/kg/min via INTRAVENOUS
  Administered 2020-11-20: 140 ug/kg/min via INTRAVENOUS
  Administered 2020-11-20: 50 mg via INTRAVENOUS
  Administered 2020-11-20: 120 mg via INTRAVENOUS
  Administered 2020-11-20: 50 mg via INTRAVENOUS

## 2020-11-20 MED ORDER — PROPOFOL 200 MG/20ML IV EMUL
INTRAVENOUS | Status: AC
Start: 2020-11-20 — End: 2020-11-20
  Filled 2020-11-20: qty 20

## 2020-11-20 MED ORDER — LACTATED RINGERS IV SOLN
INTRAVENOUS | Status: DC
Start: 2020-11-20 — End: 2020-11-21

## 2020-11-20 MED ORDER — LIDOCAINE HCL (PF) 2 % IJ SOLN
Freq: Once | INTRAMUSCULAR | Status: DC | PRN
Start: 2020-11-20 — End: 2020-11-20
  Administered 2020-11-20: 60 mg via INTRAVENOUS

## 2020-11-20 MED ORDER — LIDOCAINE HCL (PF) 2 % IJ SOLN
INTRAMUSCULAR | Status: AC
Start: 2020-11-20 — End: 2020-11-20
  Filled 2020-11-20: qty 5

## 2020-11-20 NOTE — Anesthesia Preprocedure Evaluation (Signed)
Pre-Anesthetic Note  .      Patient: Gwendolyn Stark is a 35 year old female      Procedure Information     Date/Time: 11/20/20 1000    Scheduled providers: Retta Diones., MD; Katherine Roan, MD    Procedure: EGD AND COLONOSCOPY    Diagnosis: Right lower quadrant abdominal pain [R10.31]    Location: [facility] - Gastroenterology          Relevant Problems   No relevant active problems           Previous Anesthetic History:   History reviewed. No pertinent surgical history.        Medications  Current Outpatient Medications   Medication Sig    levothyroxine (SYNTHROID) 112 MCG tablet levothyroxine 112 mcg tablet  1 TABLET ON AN EMPTY STOMACH IN THE MORNING ORALLY ONCE A DAY 30 DAYS    FLUoxetine (PROZAC) 10 MG capsule TAKE 1 CAPSULE BY MOUTH EVERY DAY    polyethylene glycol (GOLYTELY) 236 g suspension Take 4,000 mLs by mouth See Admin Instructions Take as directed prior to your colonoscopy    Simethicone 80 MG TABS Take 4 tablets by mouth See Admin Instructions Take as directed prior to colonoscopy.    amphetamine-dextroamphetamine (ADDERALL XR, 25MG,) 25 MG 24 hr capsule TAKE 1 CAPSULE (25 MG TOTAL) BY MOUTH 1 (ONE) TIME EACH DAY.    atenolol (TENORMIN) 50 MG tablet atenolol 50 mg tablet   TAKE 1 TABLET BY MOUTH EVERY DAY    cyclobenzaprine (FLEXERIL) 5 MG tablet     montelukast (SINGULAIR) 10 MG tablet montelukast 10 mg tablet   TAKE 1 TABLET BY MOUTH EVERY DAY    pantoprazole (PROTONIX) 40 MG tablet     Loratadine 10 MG CAPS     traZODone (DESYREL) 100 MG tablet trazodone 100 mg tablet   TAKE 1 TO 2 TABLETS BY MOUTH AT BEDTIME AS NEEDED     Current Facility-Administered Medications   Medication    lactated ringers infusion         Allergies:   Review of Patient's Allergies indicates:  No Known Allergies    Smoking, Alcohol, Drugs:  Social History    Tobacco Use      Smoking status: Former Smoker      Smokeless tobacco: Never Used    Alcohol use: Not on  file      Drug use: Not on file       PMHx:  Past Medical History:  No date: ADHD    Vitals  BP (!) 145/92    Pulse 60    Temp 97.5 F (36.4 C) (Temporal)    Resp 23    Ht _0  (1.676 m)    Wt 72.6 kg (160 lb)    LMP 11/04/2020    SpO2 99%    BMI 25.82 kg/m     Review of Systems        Anesthetic History:   negative anesthesia history ROS           Cardiovascular: Negative negative for cardiovascular disease.    Pulmonary: Negative. negative for Pulmonary Disease  GU/Renal: Negative for GU/renal diseases.    Hepatic: Skin negative for hepatic disease.    Neurological: Negative for neurological diseases.    Gastrointestinal: Negative for gastrointestinal diseases.    Hematological: Negative for hematological diseases.    Endocrine: Negative for endocrine diseases/disorders.    HEENT: Negative for HEENT disorders.  Musculoskeletal: Negative for musculoskeletal diseases.    Psychiatric: Negative for psychiatry diseases.    Constitutional: Negative for constitutional diseases.    Skin: Negative for skin diseases.        Physical Exam    General     Level of consciousness:  Alert and oriented (time, person, place)   no respiratory distress syndrome   Airway     Mallampati:  I    TM distance:  >3 FB    Mouth opening:  >3 FB    Neck ROM:  Full   Teeth  - normal exam  }   Heart   - normal exam     Lungs - normal exam  Breath sounds clear to auscultation               Pertinent Labs:   Lab Results   Component Value Date    NA 138 07/22/2020    K 4.3 07/22/2020    CREAT 1.0 07/22/2020    GLUCOSER 89 07/22/2020    WBC 2.8 (L) 07/22/2020    HCT 40.5 07/22/2020    PLTA 255 07/22/2020         Anesthesia Plan    ASA Score:     ASA:  2    Airway:      Mallampati:  I    Mouth opening:  >3 FB    Neck ROM:  Full    TM distance:  >3 FB    Plan: MAC    Other information:     EKG Reviewed: : Yes      Full Stomach Precaution:: No      Post-Plan::  PACU    Anesthesia Assessment and Plan:        Questions answered consent  obtained      Informed Consent:     Anesthetic plan and risks discussed with:  Patient   Patient Consented        Attending Anesthesiologist Statement:     Reassessed day of surgery: Yes        Assessment made, necessary equipment and appropriate plan in place.

## 2020-11-20 NOTE — PROVATION-GI (Signed)
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 I        obtained an informed consent from the patient reviewing the risk of the        procedure including (but not limited to) respiratory depression, perforation,        bleeding, discomfort, a possible need for surgery and unexpected reactions to        medications. The patient is aware that test has limitations and may not        detect significant lesions such as cancer or other potential diseases. The        patient was also informed that they might need a repeat colonoscopy earlier        than standard guidelines if there are changes in their symptoms or concerning        findings noted. A time out was performed with the entire procedure staff        present. The scope was passed under direct vision. Throughout the procedure,        the patient's blood pressure, pulse, and oxygen saturations were monitored        continuously. The [id number] was introduced through the anus and        advanced to the cecum, identified by its appearance. The scope was then        slowly withdrawn with confirmation of the noted findings. The colonoscopy was        performed without difficulty. The patient tolerated the procedure well.  Findings:       Non-bleeding internal hemorrhoids were found during retroflexion. The        hemorrhoids were medium-sized.       The colon (entire examined portion)  appeared normal. This was biopsied with a        cold jumbo forceps for evaluation of microscopic colitis.  Scope Withdrawal Time: 0 hours 6 minutes 31 seconds   Post Procedure Diagnosis:       - Non-bleeding internal hemorrhoids.       - The entire examined colon is normal. Biopsied.  Complications:         No immediate complications.  Recommendation:       - Await pathology results.       - High fiber diet indefinitely.  Joretta Bachelor., MD  Cler[facility] Lav Gwyneth Sprout, MD  11/20/2020 11:12:50 AM  This report has been signed electronically.  Number of Addenda: 0  Note Initiated On: 11/20/2020 10:02 AM

## 2020-11-20 NOTE — H&P (Signed)
GI Pre-procedure History and Physical Short Form  Gwendolyn Stark is an 35 year old female.    Chief Complaint: She is here for Both upper GI endoscopy and colonoscopy    HPI: 35 year old female with a history of dyspepstic symptoms and ahistory of colitis on a CT scan.    Active Problems:  Patient Active Problem List:     Mixed anxiety and depressive disorder     Attention deficit hyperactivity disorder (ADHD), predominantly inattentive type     Colitis, nonspecific     Seasonal allergies     Hypothyroidism     Lymphopenia     GERD (gastroesophageal reflux disease)     Essential hypertension     IBS (irritable bowel syndrome)     Radiculopathy of cervicothoracic region      Past Medical History:   Past Medical History:  No date: ADHD    Past Surgical History:  No past surgical history on file.    Social History:  Social History     Socioeconomic History    Marital status: Married     Spouse name: Not on file    Number of children: Not on file    Years of education: Not on file    Highest education level: Not on file   Occupational History    Not on file   Tobacco Use    Smoking status: Not on file    Smokeless tobacco: Not on file   Substance and Sexual Activity    Alcohol use: Not on file    Drug use: Not on file    Sexual activity: Not on file   Other Topics Concern    Not on file   Social History Narrative    From [place]    Working as a travel Engineer, civil (consulting)   Social Determinants of Health  Financial Resource Strain: Not on file  Food Insecurity: Not on file  Transportation Needs: Not on file  Physical Activity: Not on file  Stress: Not on file  Social Connections: Not on file  Intimate Partner Violence: Not on file  Housing Stability: Not on file    Family History:  No family history on file.      Allergies:   Review of Patient's Allergies indicates:  Not on File    Medications:   (Not in a hospital admission)      Review of Systems:  Review of Systems:  HEENT: No loose teeth, nosebleeds, glaucoma, cataracts     Cardiovascular:  No chest pain, palpitations, HTN, hypercholesterolemia, MI, heart surgery or heart failure.  Pulmonary:  No pneumonia, COPD, chronic cough,TB or asthma.    Neuro:  No stroke, seizure, migraines or loss of consciousness.    Endocrine:  No diabetes or thyroid disease.    ID:  No TB, hepatitis A, hepatitis B, hepatitis C, HIV or rheumatic fever.    GU:  No hematuria, nephrolithiasis, kidney disease or bladder disease.  Heme/Onc:  No cancer, anemia or bleeding diathesis.   Derm:  No rash, hives, or shingles.    Psych: No anxiety disorder, depression, bullemia, anorexia nervosa, alcoholism or substance abuse  Musculoskeletal: No gout, arthritis, muscle disease or lupus.      Physical Exam:  Vital Signs: There were no vitals taken for this visit.  General: nml body habitus.   HEENT: Normocephalic, atrumatic, PERRLA, Extra occular motions intact. No scleral icterus. Pharynx benign. Tongue midline.  Airway Evaluation:  Gag reflex intact: Yes  Ability to  open mouth wide:   Full  Dentures:  No  Loose teeth:  No  Neck range of motion  Full  Mallampati Airway Classification: Class II   The same as Class I except the tonsilar pillars are hidden by the tongue.  Pulmonary: Clear to auscultation and percussion. No rales, wheezes or ronchi.  Cardiovascular: S1, S2, no S3, S4, clicks, rubs or murmurs.  JVD not elevated with patient sitting.  Abdominal: Normal bowel sounds. No tenderness on light or deep palpation. No hepatomegally or spleenomegally.  Extremities: Without clubbing, cyanosis or edema.   Neurological: No asterixis. Sensation intact. Cranial nerves II - XII grossly intact. Normal gait.   Derm: No jaundice, hives or rashes.    ASA Classification: ASA Class II (a patient with mild systemic disease)    Impression:35 year old female with a history of dyspepstic symptoms and ahistory of colitis on a CT scan.    Medical Decision Making:Plan is for EGD/ Colonoscopy.

## 2020-11-20 NOTE — PROVATION-GI (Signed)
[facility]  Patient Name: Gwendolyn Stark  MRN: [id number]  CSN: [id number]  Date of Birth: [birth year]  Admit Type: Outpatient  Age: 35  Gender: Female  Note Status: Finalized  Patient Location: [name]  Referring MD:          [name] MD, MD  Procedure Date:        11/20/2020 10:12:54 AM  Procedure:             Upper GI endoscopy  Endoscopist:           Joretta Bachelor, MD  Indications for Procedure:       Dyspepsia  Medications:           Monitored Anesthesia Care  Procedure:       Just prior to the procedure, an updated history and physical was done. I        obtained an informed consent from the patient reviewing the risk of the        procedure including (but not limited to) respiratory depression, perforation,        bleeding, discomfort, a possible need for surgery and unexpected reactions to        medications. The patient is aware that test has limitations and may not        detect significant lesions such as cancer or other potential diseases. The        patient was also informed that they might need a repeat upper endoscopy        earlier than standard guidelines if there are changes in their symptoms or        concerning findings noted. A time out was performed with the entire procedure        staff present. The scope was passed under direct vision. Throughout the        procedure, the patient's blood pressure, pulse, and oxygen saturations were        monitored continuously. The [id number] was introduced through the        mouth, and advanced to the third part of duodenum. The upper GI endoscopy was        accomplished without difficulty. The patient tolerated the procedure well.  Findings:       No gross lesions were noted in the entire esophagus.       Diffuse mild inflammation characterized by erythema and friability was found        in the gastric antrum. Biopsies were taken with a cold forceps for histology.        Estimated blood loss was minimal.       No gross lesions were  noted in the entire examined duodenum.  Post Procedure Diagnosis:       - No gross lesions in the entire esophagus.       - Chronic gastritis. Biopsied.       - No gross lesions in the entire examined duodenum.  Complications:         No immediate complications.  Recommendation:       - Await pathology results.  Joretta Bachelor., MD  [facility] Gwyneth Sprout, MD  11/20/2020 11:09:34 AM  This report has been signed electronically.  Number of Addenda: 0  Note Initiated On: 11/20/2020 10:12 AM

## 2020-11-20 NOTE — Anesthesia Postprocedure Evaluation (Signed)
Anesthesia Post-Operative Evaluation Note    Patient: Gwendolyn Stark           Procedure Summary     Date: 11/20/20 Room / Location: [facility] - Gastroenterology    Anesthesia Start: 6962 Anesthesia Stop: 9528    Procedure: EGD AND COLONOSCOPY Diagnosis: Right lower quadrant abdominal pain    Scheduled Providers: Retta Diones., MD; Katherine Roan, MD Responsible Provider: Katherine Roan, MD    Anesthesia Type: MAC ASA Status: 2            POST-OPERATIVE EVALUATION    Anesthesia Post Evaluation    Vitals signs in patient's normal range: Yes  Respiratory function stable; airway patent: Yes  Cardiovascular function stable: Yes  Hydration status stable: Yes  Mental status recovered; patient participates in evaluation and/or is at baseline: Yes  Pain control satisfactory: Yes  Nausea and vomiting control satisfactory: Yes    Procedure was labor & delivery no  PostOP disposition PACU  Anesthesia Observation no significant observation      Last vitals    BP: (!) 145/92 (11/20/2020  9:58 AM)  Temp: 97.5 F (36.4 C) (11/20/2020  9:58 AM)  Pulse: 60 (11/20/2020  9:58 AM)  Resp: 20 (11/20/2020 10:41 AM)  SpO2: 99 % (11/20/2020  9:58 AM)

## 2020-11-22 LAB — SURGICAL PATH SPECIMEN GASTROINTESTINAL

## 2020-12-09 ENCOUNTER — Encounter (HOSPITAL_BASED_OUTPATIENT_CLINIC_OR_DEPARTMENT_OTHER): Payer: Self-pay | Admitting: Family Medicine

## 2020-12-09 DIAGNOSIS — F900 Attention-deficit hyperactivity disorder, predominantly inattentive type: Secondary | ICD-10-CM

## 2020-12-09 MED ORDER — AMPHETAMINE-DEXTROAMPHET ER 25 MG PO CP24
ORAL_CAPSULE | ORAL | 0 refills | Status: DC
Start: 2020-12-09 — End: 2021-01-09

## 2020-12-09 NOTE — Telephone Encounter (Signed)
PER Patient (self), Gwendolyn Stark is a 35 year old female has requested a refill of Adderall XR     Start Date: 11/01/20 End Date: 12/01/20   Written Date: 11/01/20 Expiration Date: --   Earliest Fill Date: 11/01/20           Last Office Visit: 08/05/2020  Last Physical Exam: N/A      Other Med Adult:  Most Recent BP Reading(s)  11/20/20 : 102/78        No results found for: CHOLESTEROL  No results found for: LDL  No results found for: HDL  No results found for: TG      THYROID SCREEN TSH REFLEX FT4 (uIU/mL)   Date Value   07/22/2020 1.720         No results found for: TSH    No results found for: HGBA1C    No results found for: POCA1C      No results found for: INR    SODIUM (mmol/L)   Date Value   07/22/2020 138       POTASSIUM (mmol/L)   Date Value   07/22/2020 4.3           CREATININE (mg/dL)   Date Value   60/45/4098 1.0       Documented patient preferred pharmacies:    [id number] [place], [place] - [address]  Phone: [phone] Fax: [phone]

## 2020-12-10 ENCOUNTER — Encounter (HOSPITAL_BASED_OUTPATIENT_CLINIC_OR_DEPARTMENT_OTHER): Payer: Self-pay | Admitting: Family Medicine

## 2020-12-10 MED ORDER — ATENOLOL 50 MG PO TABS
ORAL_TABLET | ORAL | 11 refills | Status: DC
Start: 2020-12-10 — End: 2021-12-17

## 2020-12-10 NOTE — Telephone Encounter (Signed)
PER Patient (self), Gwendolyn Stark is a 35 year old female has requested a refill of      -  atenolol       Last Office Visit: 08/05/2020 with Jerolyn Center, A  Last Physical Exam: n/a     HPV SCREENING Never done     Other Med Adult:  Most Recent BP Reading(s)  11/20/20 : 102/78        No results found for: CHOLESTEROL  No results found for: LDL  No results found for: HDL  No results found for: TG      THYROID SCREEN TSH REFLEX FT4 (uIU/mL)   Date Value   07/22/2020 1.720         No results found for: TSH    No results found for: HGBA1C    No results found for: POCA1C      No results found for: INR    SODIUM (mmol/L)   Date Value   07/22/2020 138       POTASSIUM (mmol/L)   Date Value   07/22/2020 4.3           CREATININE (mg/dL)   Date Value   14/38/8875 1.0        Documented patient preferred pharmacies:    [id number] [place], [place] - [address]  Phone: [phone] Fax: [phone]

## 2020-12-11 ENCOUNTER — Other Ambulatory Visit (HOSPITAL_BASED_OUTPATIENT_CLINIC_OR_DEPARTMENT_OTHER): Payer: Self-pay | Admitting: Family Medicine

## 2020-12-11 DIAGNOSIS — F418 Other specified anxiety disorders: Secondary | ICD-10-CM

## 2020-12-11 NOTE — Telephone Encounter (Signed)
PER Pharmacy, Gwendolyn Stark is a 35 year old female has requested a refill of Prozac       Please note: [facility] has picked up the last refill of Prozac 2 days ago, this request is for further refills .       Last Office Visit: 08/05/2020 with [name], MD    Last Physical Exam: n/a    HPV SCREENING Never done    Other Med Adult:  Most Recent BP Reading(s)  11/20/20 : 102/78        No results found for: CHOLESTEROL  No results found for: LDL  No results found for: HDL  No results found for: TG      THYROID SCREEN TSH REFLEX FT4 (uIU/mL)   Date Value   07/22/2020 1.720         No results found for: TSH    No results found for: HGBA1C    No results found for: POCA1C      No results found for: INR    SODIUM (mmol/L)   Date Value   07/22/2020 138       POTASSIUM (mmol/L)   Date Value   07/22/2020 4.3           CREATININE (mg/dL)   Date Value   00/94/1791 1.0       Documented patient preferred pharmacies:    [id number] [place], [place] - [address]  Phone: [phone] Fax: [phone]

## 2020-12-21 ENCOUNTER — Encounter (HOSPITAL_BASED_OUTPATIENT_CLINIC_OR_DEPARTMENT_OTHER): Payer: Self-pay | Admitting: Family Medicine

## 2021-01-09 ENCOUNTER — Ambulatory Visit: Payer: Commercial Managed Care - PPO | Attending: Family Medicine | Admitting: Family Medicine

## 2021-01-09 DIAGNOSIS — K219 Gastro-esophageal reflux disease without esophagitis: Secondary | ICD-10-CM | POA: Diagnosis present

## 2021-01-09 DIAGNOSIS — F418 Other specified anxiety disorders: Secondary | ICD-10-CM | POA: Insufficient documentation

## 2021-01-09 DIAGNOSIS — F900 Attention-deficit hyperactivity disorder, predominantly inattentive type: Secondary | ICD-10-CM | POA: Diagnosis present

## 2021-01-09 DIAGNOSIS — K589 Irritable bowel syndrome without diarrhea: Secondary | ICD-10-CM | POA: Insufficient documentation

## 2021-01-09 MED ORDER — FLUOXETINE HCL 20 MG PO CAPS
20.0000 mg | ORAL_CAPSULE | Freq: Every day | ORAL | 2 refills | Status: DC
Start: 2021-01-09 — End: 2021-04-17

## 2021-01-09 MED ORDER — HYDROXYZINE HCL 25 MG PO TABS
25.0000 mg | ORAL_TABLET | Freq: Three times a day (TID) | ORAL | 2 refills | Status: DC | PRN
Start: 2021-01-09 — End: 2021-04-17

## 2021-01-09 MED ORDER — AMPHETAMINE-DEXTROAMPHET ER 25 MG PO CP24
ORAL_CAPSULE | ORAL | 0 refills | Status: DC
Start: 2021-01-09 — End: 2021-02-16

## 2021-01-09 NOTE — Progress Notes (Signed)
[facility]  Telehealth Encounter    Patient called per clinic protocol.  Interpreter was not used for this visit.     History    S/p EGD and colonoscopy  - knows she has heartburn, took protonix in the past for 3 months but insurance only covered short term  - still has RLQ pain with stool passing through    Mood  - fluoxetine 10mg  -- has been harder to be motivated  - doesn't get tired, but then finally when waking up feels drowsy  - got Covid-19  2 weeks ago which made fatigue worse  - recently started with a CBT therapist   - takes trazodone at night to help with sleep, but can be too drowsy the next morning  - also has fluctuating 12 hour shifts      Objective:  Unable to obtain vital signs not for this telehealth visit.    Exam:  Gen: No acute distress  Respiratory: Speaking in full sentences, no dyspnea  Psych: Normal mood/affect    Data review:   11/20/20  - Colonoscopy with non bleeding internal hemorrhoids, normal visualized colon with normal path  - EGD with no gross lesions in esophagus, duodenum; chronic gastritis (conf with path), no H pylori    Assessment/Plan:    1. Attention deficit hyperactivity disorder (ADHD), predominantly inattentive type  More struggles recently with motivation, getting out of bed. Discussed that it would be best to adjust mood medication, continue with therapy rather than altering stimulant dose/formulation. Refill provided; would monitor HR/weight/BP at next visit.   - amphetamine-dextroamphetamine (ADDERALL XR, 25MG ,) 25 MG 24 hr capsule; TAKE 1 CAPSULE (25 MG TOTAL) BY MOUTH 1 (ONE) TIME EACH DAY.  Dispense: 30 capsule; Refill: 0    2. Mixed anxiety and depressive disorder  Discussed increasing fluoxetine due to more depression and sleep impairment. Reviewed interrelationship between mood/attention/sleep/motivation and congratulated her on finding an outside therapist. Continue to work on CBT approaches to organization, motivation, and sleep. Can use  hydroxyzine PRN for anxiety, trazodone PRN for sleep.   - FLUoxetine (PROZAC) 20 MG capsule; Take 1 capsule by mouth in the morning.  Dispense: 30 capsule; Refill: 2  - hydrOXYzine (ATARAX) 25 MG tablet; Take 1 tablet by mouth 3 (three) times daily as needed for Itching  Dispense: 90 tablet; Refill: 2    3. IBS  4. Gastritis  H/o concern for colitis and epigastric burning with recent EGD/colonscopy showing chronic gastritis, no colitis. Recommend f/u with GI to discuss findings and treatment plan/time course of PPI use.    Follow-up: 2 month(s) or sooner for in person f/u - BP, mood etc. Also requesting RN visit for flu shot -- sent request to [facility]    I spent a total of 30 minutes on this visit on the date of service (total time includes all activities performed on the date of service)      11/22/20, MD

## 2021-02-16 ENCOUNTER — Encounter (HOSPITAL_BASED_OUTPATIENT_CLINIC_OR_DEPARTMENT_OTHER): Payer: Self-pay | Admitting: Family Medicine

## 2021-02-16 DIAGNOSIS — F900 Attention-deficit hyperactivity disorder, predominantly inattentive type: Secondary | ICD-10-CM

## 2021-02-17 MED ORDER — AMPHETAMINE-DEXTROAMPHET ER 25 MG PO CP24
ORAL_CAPSULE | ORAL | 0 refills | Status: DC
Start: 2021-02-17 — End: 2021-03-27

## 2021-02-17 NOTE — Telephone Encounter (Signed)
PER Patient (self),@ is a 35 year old female has requested a refill of ADDERALL XR 25MG       Last prescribed - start date: 01/09/21 end date: 02/08/21     Last Office Visit: 01/09/21 with 01/11/21  Last Physical Exam: never done     Other Med Adult:  Most Recent BP Reading(s)  11/20/20 : 102/78        No results found for: CHOLESTEROL  No results found for: LDL  No results found for: HDL  No results found for: TG      THYROID SCREEN TSH REFLEX FT4 (uIU/mL)   Date Value   07/22/2020 1.720         No results found for: TSH    No results found for: HGBA1C    No results found for: POCA1C      No results found for: INR    SODIUM (mmol/L)   Date Value   07/22/2020 138       POTASSIUM (mmol/L)   Date Value   07/22/2020 4.3           CREATININE (mg/dL)   Date Value   07/24/2020 1.0        Documented patient preferred pharmacies:    [id number] [place], [place] - [address]  Phone: [phone] Fax: [phone]

## 2021-02-19 ENCOUNTER — Other Ambulatory Visit (HOSPITAL_BASED_OUTPATIENT_CLINIC_OR_DEPARTMENT_OTHER): Payer: Self-pay

## 2021-02-19 ENCOUNTER — Ambulatory Visit: Payer: Commercial Managed Care - PPO | Attending: Family Medicine | Admitting: Family Medicine

## 2021-02-19 ENCOUNTER — Other Ambulatory Visit: Payer: Self-pay

## 2021-02-19 ENCOUNTER — Encounter (HOSPITAL_BASED_OUTPATIENT_CLINIC_OR_DEPARTMENT_OTHER): Payer: Self-pay | Admitting: Family Medicine

## 2021-02-19 VITALS — BP 122/82 | HR 91 | Temp 97.0°F | Wt 151.0 lb

## 2021-02-19 DIAGNOSIS — I10 Essential (primary) hypertension: Secondary | ICD-10-CM | POA: Insufficient documentation

## 2021-02-19 DIAGNOSIS — F900 Attention-deficit hyperactivity disorder, predominantly inattentive type: Secondary | ICD-10-CM | POA: Insufficient documentation

## 2021-02-19 DIAGNOSIS — F418 Other specified anxiety disorders: Secondary | ICD-10-CM | POA: Insufficient documentation

## 2021-02-19 DIAGNOSIS — R233 Spontaneous ecchymoses: Secondary | ICD-10-CM | POA: Diagnosis present

## 2021-02-19 LAB — FERRITIN: FERRITIN: 23 ng/mL (ref 13–150)

## 2021-02-19 LAB — CBC, PLATELET & DIFFERENTIAL
ABSOLUTE BASO COUNT: 0 10*3/uL (ref 0.0–0.1)
ABSOLUTE EOSINOPHIL COUNT: 0.1 10*3/uL (ref 0.0–0.8)
ABSOLUTE IMM GRAN COUNT: 0.02 10*3/uL (ref 0.00–0.03)
ABSOLUTE LYMPH COUNT: 0.6 10*3/uL (ref 0.6–5.9)
ABSOLUTE MONO COUNT: 0.4 10*3/uL (ref 0.2–1.4)
ABSOLUTE NEUTROPHIL COUNT: 2.5 10*3/uL (ref 1.6–8.3)
ABSOLUTE NRBC COUNT: 0 10*3/uL (ref 0.0–0.0)
BASOPHIL %: 0.8 % (ref 0.0–1.2)
EOSINOPHIL %: 1.4 % (ref 0.0–7.0)
HEMATOCRIT: 41.3 % (ref 34.1–44.9)
HEMOGLOBIN: 13.5 g/dL (ref 11.2–15.7)
IMMATURE GRANULOCYTE %: 0.6 % — ABNORMAL HIGH (ref 0.0–0.4)
LYMPHOCYTE %: 15.3 % (ref 15.0–54.0)
MEAN CORP HGB CONC: 32.7 g/dL (ref 31.0–37.0)
MEAN CORPUSCULAR HGB: 29.5 pg (ref 26.0–34.0)
MEAN CORPUSCULAR VOL: 90.4 fl (ref 80.0–100.0)
MEAN PLATELET VOLUME: 11.2 fL (ref 8.7–12.5)
MONOCYTE %: 12.3 % (ref 4.0–13.0)
NEUTROPHIL %: 69.6 % (ref 40.0–75.0)
NRBC %: 0 % (ref 0.0–0.0)
PLATELET COUNT: 284 10*3/uL (ref 150–400)
RBC DISTRIBUTION WIDTH STD DEV: 42.9 fL (ref 35.1–46.3)
RED BLOOD CELL COUNT: 4.57 M/uL (ref 3.90–5.20)
WHITE BLOOD CELL COUNT: 3.6 10*3/uL — ABNORMAL LOW (ref 4.0–11.0)

## 2021-02-19 LAB — TOTAL IRON BINDING CAPACITY: TOTAL IRON BIND CAPACITY CALC: 428 ug/dL (ref 280–504)

## 2021-02-19 LAB — IRON: IRON: 46 ug/dL — ABNORMAL LOW (ref 50–170)

## 2021-02-19 NOTE — Progress Notes (Signed)
[facility]   Outpatient Progress Note    Gwendolyn Stark is a 35 year old female seen on 02/19/21.  No chief complaint on file.      Subjective       Interpreter was not used for this visit.     History    HTN  - took atenolol this morning    ADHD  - symptoms worse related to stress, but taking medications consistently with good effect    Mood  - stressors at home but managing  - increased dose of fluoxetine is okay  - not taking the hydroxyzine much but thinks she might want to use it more  - started a new therapist     Easy bruising  - x weeks  - no bleeding issues  - unclear cause of the burises themselves      History reviewed and updated as appropriate.       Objective       Exam:   weight is 68.5 kg (151 lb). Her temporal temperature is 97 F (36.1 C). Her blood pressure is 122/82 and her pulse is 91. Her oxygen saturation is 98%.     Physical Exam  Vitals reviewed.   Constitutional:       Appearance: Normal appearance.   HENT:      Head: Normocephalic.   Cardiovascular:      Rate and Rhythm: Normal rate and regular rhythm.      Pulses: Normal pulses.      Heart sounds: Normal heart sounds.   Pulmonary:      Effort: Pulmonary effort is normal. No respiratory distress.      Breath sounds: Normal breath sounds. No wheezing or rales.   Skin:     General: Skin is warm and dry.   Neurological:      General: No focal deficit present.      Mental Status: She is alert. Mental status is at baseline.      Gait: Gait normal.   Psychiatric:         Mood and Affect: Mood normal.         Behavior: Behavior normal.         Data review:   07/22/20 15:59   WHITE BLOOD CELL COUNT 2.8 (L)   RED BLOOD CELL COUNT 4.63   HEMOGLOBIN 13.3   HEMATOCRIT 40.5   MEAN CORPUSCULAR VOL 87.5   MEAN CORPUSCULAR HGB 28.7   MEAN CORP HGB CONC 32.8   RBC DISTRIBUTION WIDTH STD DEV 39.0   PLATELET COUNT 255   MEAN PLATELET VOLUME 11.7   NRBC % 0.0   ABSOLUTE NRBC COUNT 0.0   POLYMORPHONUCLEAR (SEGS) % 32.0 (L)   BAND  NEUTROPHILS % 17.0 (H)   LYMPHOCYTES % 33.0   ATYPICAL LYMPHOCYTES % 4.0   MONOCYTES % 6.0   EOSINOPHILS % 4.0   BASOPHILS % 4.0 (H)   ABSOLUTE NEUT COUNT MANUAL 1.4 (L)   ABSOLUTE LYMPH COUNT MANUAL 1.0   ABSOLUTE MONOCYTE COUNT MANUAL 0.2   ABSOLUTE EO COUNT MANUAL 0.1   ABSOLUTE BASOPHIL COUNT MANUAL 0.1   NUCLEATED RED BLOOD CELLS 0   PLATELET ESTIMATE NORMAL   LARGE PLATELET PRESENT !   GIANT PLATELET PRESENT !   POIKILOCYTOSIS MODERATE !   OVALOCYTES MODERATE !   BURR CELLS SLIGHT !   ELLIPTOCYTES SLIGHT   (L): Data is abnormally low  (H): Data is abnormally high  !: Data is abnormal  Assessment & Plan    1. Attention deficit hyperactivity disorder (ADHD), predominantly inattentive type  Continues to derive benefit and improved function from Adderall at current dose. No concern for misuse or diversion; will plan for next UDS ~06/2021.     2. Essential hypertension  At goal today; OK to continue current medications.     3. Mixed anxiety and depression  Social stressors at home related to wife's health are improving; no other specific needs at this time. Plan to continue fluoxetine 20mg , hydroxyzine PRN. If no improvement or adverse effects could consider buspirone TID. Can continue with outside psychotherapist.    4. Easy bruising  Prior CBC notable for leukopenia, per Jaana this has been a chronic issue. Will repeat with iron studies.   - CBC, PLATELET & DIFFERENTIAL  - FERRITIN  - IRON  - TOTAL IRON BINDING CAPACITY        Follow-up: 3 month(s)     I spent a total of 30 minutes on this visit on the date of service (total time includes all activities performed on the date of service)        Trula Ore, MD MPH  Family Medicine Physician

## 2021-03-27 ENCOUNTER — Encounter (HOSPITAL_BASED_OUTPATIENT_CLINIC_OR_DEPARTMENT_OTHER): Payer: Self-pay | Admitting: Family Medicine

## 2021-03-27 DIAGNOSIS — F900 Attention-deficit hyperactivity disorder, predominantly inattentive type: Secondary | ICD-10-CM

## 2021-03-27 MED ORDER — AMPHETAMINE-DEXTROAMPHET ER 25 MG PO CP24
ORAL_CAPSULE | ORAL | 0 refills | Status: DC
Start: 2021-03-27 — End: 2021-05-26

## 2021-03-27 NOTE — Telephone Encounter (Signed)
PER Patient (self),@ is a 35 year old female has requested a refill of adderall      Last prescribed - start date: 02/17/21 end date: 03/18/21     Last Office Visit: 02/19/21  Last Physical Exam: na  There are no preventive care reminders to display for this patient.     Other Med Adult:  Most Recent BP Reading(s)  02/19/21 : 122/82        No results found for: CHOLESTEROL  No results found for: LDL  No results found for: HDL  No results found for: TG      THYROID SCREEN TSH REFLEX FT4 (uIU/mL)   Date Value   07/22/2020 1.720         No results found for: TSH    No results found for: HGBA1C    No results found for: POCA1C      No results found for: INR    SODIUM (mmol/L)   Date Value   07/22/2020 138       POTASSIUM (mmol/L)   Date Value   07/22/2020 4.3           CREATININE (mg/dL)   Date Value   03/40/3524 1.0        Documented patient preferred pharmacies:    [id number] [place], [place] - [address]  Phone: [phone] Fax: [phone]

## 2021-03-29 ENCOUNTER — Encounter (HOSPITAL_BASED_OUTPATIENT_CLINIC_OR_DEPARTMENT_OTHER): Payer: Self-pay | Admitting: Family Medicine

## 2021-03-29 DIAGNOSIS — F900 Attention-deficit hyperactivity disorder, predominantly inattentive type: Secondary | ICD-10-CM

## 2021-03-29 NOTE — Telephone Encounter (Signed)
Please refuse this med already sent in due to drug class cant denied it thanks

## 2021-04-17 ENCOUNTER — Other Ambulatory Visit (HOSPITAL_BASED_OUTPATIENT_CLINIC_OR_DEPARTMENT_OTHER): Payer: Self-pay | Admitting: Family Medicine

## 2021-04-17 DIAGNOSIS — F418 Other specified anxiety disorders: Secondary | ICD-10-CM

## 2021-04-17 NOTE — Telephone Encounter (Signed)
PER Pharmacy, Gwendolyn Stark is a 35 year old female has requested a refill of fluoxetine and hydroxyzine.      Last Office Visit: 02/19/2021 [name], MD     Last Physical Exam: n/a    There are no preventive care reminders to display for this patient.    Other Med Adult:  Most Recent BP Reading(s)  02/19/21 : 122/82        No results found for: CHOLESTEROL  No results found for: LDL  No results found for: HDL  No results found for: TG      THYROID SCREEN TSH REFLEX FT4 (uIU/mL)   Date Value   07/22/2020 1.720         No results found for: TSH    No results found for: HGBA1C    No results found for: POCA1C      No results found for: INR    SODIUM (mmol/L)   Date Value   07/22/2020 138       POTASSIUM (mmol/L)   Date Value   07/22/2020 4.3           CREATININE (mg/dL)   Date Value   48/83/0141 1.0       Documented patient preferred pharmacies:    [id number] [place], [place] - [address]  Phone: [phone] Fax: [phone]

## 2021-05-05 ENCOUNTER — Encounter (HOSPITAL_BASED_OUTPATIENT_CLINIC_OR_DEPARTMENT_OTHER): Payer: Self-pay | Admitting: Family Medicine

## 2021-05-14 ENCOUNTER — Encounter (HOSPITAL_BASED_OUTPATIENT_CLINIC_OR_DEPARTMENT_OTHER): Payer: Self-pay

## 2021-05-26 ENCOUNTER — Encounter (HOSPITAL_BASED_OUTPATIENT_CLINIC_OR_DEPARTMENT_OTHER): Payer: Self-pay | Admitting: Family Medicine

## 2021-05-26 DIAGNOSIS — F900 Attention-deficit hyperactivity disorder, predominantly inattentive type: Secondary | ICD-10-CM

## 2021-05-26 MED ORDER — AMPHETAMINE-DEXTROAMPHET ER 25 MG PO CP24
ORAL_CAPSULE | ORAL | 0 refills | Status: DC
Start: 2021-05-26 — End: 2021-06-30

## 2021-05-26 NOTE — Telephone Encounter (Signed)
PER Patient (self), Gwendolyn Stark is a 36 year old female has requested a refill of Adderall XR  Last prescribed - start date: 03/27/21 end date: 04/25/21  Last Office Visit: [place] Cerise  Last Physical Exam: n/a  There are no preventive care reminders to display for this patient.  ADHD Med: Most Recent BP Reading(s)  02/19/21 : 122/82   Most Recent Weight Reading(s)  02/19/21 : 68.5 kg (151 lb)    Other Med Adult:  Most Recent BP Reading(s)  02/19/21 : 122/82        No results found for: CHOLESTEROL  No results found for: LDL  No results found for: HDL  No results found for: TG      THYROID SCREEN TSH REFLEX FT4 (uIU/mL)   Date Value   07/22/2020 1.720         No results found for: TSH    No results found for: HGBA1C    No results found for: POCA1C      No results found for: INR    SODIUM (mmol/L)   Date Value   07/22/2020 138       POTASSIUM (mmol/L)   Date Value   07/22/2020 4.3           CREATININE (mg/dL)   Date Value   03/54/6568 1.0     Documented patient preferred pharmacies:    [id number] [place], [place] - [address]  Phone: [phone] Fax: [phone]

## 2021-05-30 ENCOUNTER — Ambulatory Visit (HOSPITAL_BASED_OUTPATIENT_CLINIC_OR_DEPARTMENT_OTHER): Payer: Commercial Managed Care - PPO | Admitting: Family Medicine

## 2021-06-03 ENCOUNTER — Ambulatory Visit: Payer: Commercial Managed Care - PPO | Attending: Family Medicine | Admitting: Family Medicine

## 2021-06-03 DIAGNOSIS — F418 Other specified anxiety disorders: Secondary | ICD-10-CM

## 2021-06-03 DIAGNOSIS — F900 Attention-deficit hyperactivity disorder, predominantly inattentive type: Secondary | ICD-10-CM | POA: Insufficient documentation

## 2021-06-03 MED ORDER — ATOMOXETINE HCL 40 MG PO CAPS
40.0000 mg | ORAL_CAPSULE | Freq: Every day | ORAL | 1 refills | Status: DC
Start: 2021-06-03 — End: 2021-06-30
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
## 2021-06-04 ENCOUNTER — Telehealth (HOSPITAL_BASED_OUTPATIENT_CLINIC_OR_DEPARTMENT_OTHER): Payer: Self-pay

## 2021-06-04 NOTE — Telephone Encounter (Signed)
lvmtcb back for f u

## 2021-06-11 ENCOUNTER — Encounter (HOSPITAL_BASED_OUTPATIENT_CLINIC_OR_DEPARTMENT_OTHER): Payer: Self-pay | Admitting: Gastroenterology

## 2021-06-13 ENCOUNTER — Other Ambulatory Visit (HOSPITAL_BASED_OUTPATIENT_CLINIC_OR_DEPARTMENT_OTHER): Payer: Self-pay | Admitting: Family Medicine

## 2021-06-13 DIAGNOSIS — F900 Attention-deficit hyperactivity disorder, predominantly inattentive type: Secondary | ICD-10-CM

## 2021-06-13 NOTE — Telephone Encounter (Signed)
PER Pharmacy, Gwendolyn Stark is a 36 year old female has requested a refill of      - atomoxetine (STRATTERA) 40 MG capsule      Last Office Visit: 06/03/21 with pcp  Last Physical Exam: never done     There are no preventive care reminders to display for this patient.     Other Med Adult:  Most Recent BP Reading(s)  02/19/21 : 122/82        No results found for: CHOLESTEROL  No results found for: LDL  No results found for: HDL  No results found for: TG      THYROID SCREEN TSH REFLEX FT4 (uIU/mL)   Date Value   07/22/2020 1.720         No results found for: TSH    No results found for: HGBA1C    No results found for: POCA1C      No results found for: INR    SODIUM (mmol/L)   Date Value   07/22/2020 138       POTASSIUM (mmol/L)   Date Value   07/22/2020 4.3           CREATININE (mg/dL)   Date Value   34/91/7915 1.0        Documented patient preferred pharmacies:    [id number] [place], [place] - [address]  Phone: [phone] Fax: [phone]

## 2021-06-15 LAB — RESPIRATORY PANEL EXTENDED CARE EVERYWHERE: COVID-19 CARE EVERYWHERE: NEGATIVE

## 2021-06-18 LAB — RESPIRATORY PANEL EXTENDED CARE EVERYWHERE

## 2021-06-19 LAB — COVID-19 CARE EVERYWHERE: COVID-19 CARE EVERYWHERE: NEGATIVE

## 2021-06-19 LAB — HEMOGLOBIN A1C CARE EVERYWHERE
HEMOGLOBIN A1C CARE EVERYWHERE: 5.3 % (ref 4.2–5.6)
MEAN BLOOD GLUCOSE CARE EVERYWHERE: 105 mg/dL

## 2021-06-20 LAB — RESPIRATORY PANEL EXTENDED CARE EVERYWHERE

## 2021-06-20 LAB — HEMOGLOBIN A1C CARE EVERYWHERE
HEMOGLOBIN A1C CARE EVERYWHERE: 5.2 % (ref 4.2–5.6)
MEAN BLOOD GLUCOSE CARE EVERYWHERE: 103 mg/dL

## 2021-06-21 LAB — COVID-19 CARE EVERYWHERE: COVID-19 CARE EVERYWHERE: NEGATIVE

## 2021-06-23 LAB — RESPIRATORY PANEL EXTENDED CARE EVERYWHERE

## 2021-06-24 LAB — COVID-19 CARE EVERYWHERE: COVID-19 CARE EVERYWHERE: NEGATIVE

## 2021-06-30 ENCOUNTER — Other Ambulatory Visit (HOSPITAL_BASED_OUTPATIENT_CLINIC_OR_DEPARTMENT_OTHER): Payer: Self-pay | Admitting: Family Medicine

## 2021-06-30 ENCOUNTER — Ambulatory Visit: Payer: Commercial Managed Care - PPO | Attending: Family Medicine | Admitting: Family Medicine

## 2021-06-30 ENCOUNTER — Encounter (HOSPITAL_BASED_OUTPATIENT_CLINIC_OR_DEPARTMENT_OTHER): Payer: Self-pay | Admitting: Family Medicine

## 2021-06-30 DIAGNOSIS — F29 Unspecified psychosis not due to a substance or known physiological condition: Secondary | ICD-10-CM | POA: Insufficient documentation

## 2021-06-30 DIAGNOSIS — F900 Attention-deficit hyperactivity disorder, predominantly inattentive type: Secondary | ICD-10-CM

## 2021-06-30 DIAGNOSIS — F3170 Bipolar disorder, currently in remission, most recent episode unspecified: Secondary | ICD-10-CM | POA: Insufficient documentation

## 2021-06-30 NOTE — Telephone Encounter (Signed)
dc'ed by provider on 06/30/21

## 2021-06-30 NOTE — Progress Notes (Signed)
[facility] TELEVISIT    Subjective:   Gwendolyn Stark is a 36 year old female who presents with a chief complaint of Hospital F/U       #hosp f/up  Checked in 2/19  Lots of issues - loss in family, trouble coping--> started to get manic  For a couple months, friends pointed it out  Sx: talking really fast, one thought to the next, delusions of grandeur  Got out of control--> paranoid delusions  Had some SI while in the hospital, not since discharge    Sent to [facility], there for 1 week  Changed all medications  Diagnosed with bipolar d/o  D/c 2/28, continues on same medications since discharge  Ready to return to work  Note needs to say cleared to return to work  Has psych appt 07/02/21 - [facility]   - Reece Levy    No longer on fluoxetine (Wellbutrin prior, fluoxetine was increased)    Since home, more tired, more down, feels like catching up on loss of sleep/not eating    Works in ER as a travel Engineer, civil (consulting)  Works 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  vitals deferred in setting of COVID pandemic    Physical Exam  Speaking in complete sentences, breathing comfortably  Normal affect    Assessment and Plan:  Gwendolyn Stark is a 36 year old female with:    Problem List        Risk Adjustment Coding    Bipolar disorder (HCC)    Overview     Diagnosed 05/2021 during hospitalization for manic episode  Stable on current meds with outside psych f/up -  [facility]     Prior depression/anxiety, comorbid ADHD  Features of depression: low energy/motivation, poor sleep  Previously tried escitalopram with sexual side effects  Tried bupropion x years with unclear benefit  Fluoxetine/Adderall led to activation               F/u plan: with psychiatry or prn, gave note for work until then    Stevphen Rochester, MD,  06/30/2021

## 2021-07-02 ENCOUNTER — Other Ambulatory Visit (HOSPITAL_BASED_OUTPATIENT_CLINIC_OR_DEPARTMENT_OTHER): Payer: Self-pay | Admitting: Family Medicine

## 2021-07-02 DIAGNOSIS — E038 Other specified hypothyroidism: Secondary | ICD-10-CM

## 2021-07-02 DIAGNOSIS — E063 Autoimmune thyroiditis: Secondary | ICD-10-CM

## 2021-07-02 NOTE — Telephone Encounter (Signed)
This prescription refill request has passed the Rx Renewal Authorization protocol.  This medication has been approved and sent to the patient's preferred pharmacy.      PER Pharmacy, Gwendolyn Stark is a 36 year old female has requested a refill of levothyroxine.      Last Office Visit: 06/30/2021 with Mintzer, E  Last Physical Exam: n/a    There are no preventive care reminders to display for this patient.    Other Med Adult:  Most Recent BP Reading(s)  02/19/21 : 122/82        No results found for: CHOLESTEROL  No results found for: LDL  No results found for: HDL  No results found for: TG      THYROID SCREEN TSH REFLEX FT4 (uIU/mL)   Date Value   07/22/2020 1.720         No results found for: TSH    HEMOGLOBIN A1C CARE EVERYWHERE (%)   Date Value   06/19/2021 5.2       No results found for: POCA1C      No results found for: INR    SODIUM (mmol/L)   Date Value   07/22/2020 138       POTASSIUM (mmol/L)   Date Value   07/22/2020 4.3           CREATININE (mg/dL)   Date Value   07/22/2020 1.0       Documented patient preferred pharmacies:    [facility] [place], [place]  Phone: [phone] Fax: [phone]

## 2021-07-07 ENCOUNTER — Encounter (HOSPITAL_BASED_OUTPATIENT_CLINIC_OR_DEPARTMENT_OTHER): Payer: Self-pay | Admitting: Family Medicine

## 2021-07-10 ENCOUNTER — Ambulatory Visit: Payer: Commercial Managed Care - PPO | Attending: Family Medicine | Admitting: Family Medicine

## 2021-07-10 DIAGNOSIS — F3170 Bipolar disorder, currently in remission, most recent episode unspecified: Secondary | ICD-10-CM | POA: Insufficient documentation

## 2021-07-10 DIAGNOSIS — F900 Attention-deficit hyperactivity disorder, predominantly inattentive type: Secondary | ICD-10-CM | POA: Diagnosis present

## 2021-07-10 NOTE — Progress Notes (Signed)
[facility]  Telehealth Encounter    Patient called per clinic protocol.  Interpreter was not used for this visit.     History    Mood follow up  - "went into exposure therapy a little too hard"   - resulted in her cutting off a relationship with her mom  - got into a manic state  - went to [place], stayed for a week which "helped get me back to baseline"   - now taking risperdal, lamotrigine, atenolol, levothyroxine; seroquel as needed for sleep; also OK to keep taking adderall per psychiatrist  - has been concerned that rotating shift work might be contributing to mood instability and talked to [employer] occupational health (her place of employment) about getting an accommodation   - Seeing Reece Levy with [facility] for psychiatric care, has follow up on Monday    Objective:  Unable to obtain vital signs not for this telehealth visit.    Exam:  Gen: No acute distress  Respiratory: Speaking in full sentences, no dyspnea  Psych: Anxious mood/affect, good insight    Data review: N/A    Assessment/Plan:    1. Bipolar affective disorder in remission (HCC)  2. Attention deficit hyperactivity disorder (ADHD), predominantly inattentive type  Summarized recent medication changes and plan of psychiatric care with new diagnosis of bipolar disorder exposed in the setting of trauma therapy.   Continuing with outside psychiatric NP.   Undergoing occupational health evaluation for accommodation for dayshift work only - will gladly provide primary care documentation as needed. Letter sent via MyChart, and can schedule follow up visit ideally in person if more detailed paperwork is needed.        Follow-up: 2 month(s) or sooner    I spent a total of 25 minutes on this visit on the date of service (total time includes all activities performed on the date of service)      [name], MD

## 2021-07-15 ENCOUNTER — Other Ambulatory Visit (HOSPITAL_BASED_OUTPATIENT_CLINIC_OR_DEPARTMENT_OTHER): Payer: Self-pay | Admitting: Family Medicine

## 2021-07-15 DIAGNOSIS — F900 Attention-deficit hyperactivity disorder, predominantly inattentive type: Secondary | ICD-10-CM

## 2021-07-15 NOTE — Telephone Encounter (Signed)
dc'ed by provider for therapy completed on 06/30/21

## 2021-07-25 ENCOUNTER — Encounter (HOSPITAL_BASED_OUTPATIENT_CLINIC_OR_DEPARTMENT_OTHER): Payer: Self-pay | Admitting: Family Medicine

## 2021-07-28 ENCOUNTER — Other Ambulatory Visit (HOSPITAL_BASED_OUTPATIENT_CLINIC_OR_DEPARTMENT_OTHER): Payer: Self-pay | Admitting: Family Medicine

## 2021-07-28 ENCOUNTER — Encounter (HOSPITAL_BASED_OUTPATIENT_CLINIC_OR_DEPARTMENT_OTHER): Payer: Self-pay | Admitting: Family Medicine

## 2021-07-28 DIAGNOSIS — F900 Attention-deficit hyperactivity disorder, predominantly inattentive type: Secondary | ICD-10-CM

## 2021-07-28 MED ORDER — AMPHETAMINE-DEXTROAMPHET ER 25 MG PO CP24
ORAL_CAPSULE | ORAL | 0 refills | Status: AC
Start: 2021-07-28 — End: 2021-08-26

## 2021-12-17 ENCOUNTER — Other Ambulatory Visit (HOSPITAL_BASED_OUTPATIENT_CLINIC_OR_DEPARTMENT_OTHER): Payer: Self-pay | Admitting: Family Medicine

## 2021-12-17 NOTE — Telephone Encounter (Signed)
This prescription refill request has passed the Rx Renewal Authorization protocol.  This medication has been approved and sent to the patient's preferred pharmacy.      PER Pharmacy, Gwendolyn Stark is a 36 year old female has requested a refill of atenolol.      Last Office Visit: 07/10/21 with Jerolyn Center A  Last Physical Exam: n/a    There are no preventive care reminders to display for this patient.    HTN Med:    Most Recent BP Reading(s)  02/19/21 : 122/82  11/20/20 : 102/78  07/22/20 : 116/72      Documented patient preferred pharmacies:    [id number] [place], [place] - [address]  Phone: [phone] Fax: [phone]

## 2022-02-18 ENCOUNTER — Other Ambulatory Visit (HOSPITAL_BASED_OUTPATIENT_CLINIC_OR_DEPARTMENT_OTHER): Payer: Self-pay | Admitting: Family Medicine

## 2022-02-18 DIAGNOSIS — F900 Attention-deficit hyperactivity disorder, predominantly inattentive type: Secondary | ICD-10-CM

## 2022-12-29 ENCOUNTER — Other Ambulatory Visit (HOSPITAL_BASED_OUTPATIENT_CLINIC_OR_DEPARTMENT_OTHER): Payer: Self-pay | Admitting: Family Medicine

## 2022-12-29 NOTE — Telephone Encounter (Signed)
[facility], Gwendolyn Stark is a 37 year old female has requested a refill of atenolol.    This refill request failed protocol because Patient needs an updated appt. Per protocol orders, sending 30 day 0 refills. Front desk to schedule appt. Thank you!           Last Office Visit: 07/10/21 with Jerolyn Center, A.  Last Physical Exam: N/A    There are no preventive care reminders to display for this patient.    Other Med Adult:  Most Recent BP Reading(s)  02/19/21 : 122/82        No results found for: "CHOLESTEROL"  No results found for: "LDL"  No results found for: "HDL"  No results found for: "TG"      THYROID SCREEN TSH REFLEX FT4 (uIU/mL)   Date Value   07/22/2020 1.720         No results found for: "TSH"    HEMOGLOBIN A1C CARE EVERYWHERE (%)   Date Value   06/19/2021 5.2       No results found for: "POCA1C"      No results found for: "INR"    SODIUM (mmol/L)   Date Value   07/22/2020 138       POTASSIUM (mmol/L)   Date Value   07/22/2020 4.3           CREATININE (mg/dL)   Date Value   16/01/9603 1.0       Documented patient preferred pharmacies:    [id number] - [place], [place] - [address]  Phone: [phone] Fax: [phone]
